# Patient Record
Sex: Female | Born: 1955 | Race: White | Hispanic: No | Marital: Married | Smoking: Former smoker
Health system: Southern US, Community
[De-identification: ages and names within clinical notes are randomized; demographics above are authoritative.]

## PROBLEM LIST (undated history)

## (undated) DIAGNOSIS — M1990 Unspecified osteoarthritis, unspecified site: Secondary | ICD-10-CM

## (undated) DIAGNOSIS — F419 Anxiety disorder, unspecified: Secondary | ICD-10-CM

## (undated) DIAGNOSIS — K219 Gastro-esophageal reflux disease without esophagitis: Secondary | ICD-10-CM

## (undated) HISTORY — DX: Unspecified osteoarthritis, unspecified site: M19.90

## (undated) HISTORY — PX: VAGINAL HYSTERECTOMY: SUR661

## (undated) HISTORY — DX: Gastro-esophageal reflux disease without esophagitis: K21.9

## (undated) HISTORY — PX: OTHER SURGICAL HISTORY: SHX169

## (undated) HISTORY — DX: Anxiety disorder, unspecified: F41.9

---

## 1999-08-18 ENCOUNTER — Other Ambulatory Visit: Admission: RE | Admit: 1999-08-18 | Discharge: 1999-08-18 | Payer: Self-pay | Admitting: *Deleted

## 2000-06-02 ENCOUNTER — Ambulatory Visit (HOSPITAL_COMMUNITY): Admission: RE | Admit: 2000-06-02 | Discharge: 2000-06-02 | Payer: Self-pay | Admitting: Family Medicine

## 2000-06-02 ENCOUNTER — Encounter: Payer: Self-pay | Admitting: Family Medicine

## 2000-06-02 ENCOUNTER — Encounter (INDEPENDENT_AMBULATORY_CARE_PROVIDER_SITE_OTHER): Payer: Self-pay | Admitting: *Deleted

## 2002-07-10 ENCOUNTER — Other Ambulatory Visit: Admission: RE | Admit: 2002-07-10 | Discharge: 2002-07-10 | Payer: Self-pay | Admitting: Obstetrics and Gynecology

## 2003-07-27 ENCOUNTER — Other Ambulatory Visit: Admission: RE | Admit: 2003-07-27 | Discharge: 2003-07-27 | Payer: Self-pay | Admitting: Obstetrics and Gynecology

## 2004-07-28 ENCOUNTER — Other Ambulatory Visit: Admission: RE | Admit: 2004-07-28 | Discharge: 2004-07-28 | Payer: Self-pay | Admitting: Obstetrics and Gynecology

## 2006-06-15 ENCOUNTER — Encounter: Admission: RE | Admit: 2006-06-15 | Discharge: 2006-06-15 | Payer: Self-pay

## 2006-08-26 ENCOUNTER — Encounter: Payer: Self-pay | Admitting: Oral and Maxillofacial Surgery

## 2006-09-09 ENCOUNTER — Encounter: Payer: Self-pay | Admitting: Oral and Maxillofacial Surgery

## 2008-11-05 ENCOUNTER — Ambulatory Visit: Payer: Self-pay | Admitting: Internal Medicine

## 2008-11-19 ENCOUNTER — Ambulatory Visit: Payer: Self-pay | Admitting: Internal Medicine

## 2009-04-29 ENCOUNTER — Encounter: Admission: RE | Admit: 2009-04-29 | Discharge: 2009-04-29 | Payer: Self-pay | Admitting: Obstetrics and Gynecology

## 2010-03-14 ENCOUNTER — Encounter: Admission: RE | Admit: 2010-03-14 | Discharge: 2010-03-14 | Payer: Self-pay | Admitting: Family Medicine

## 2010-06-10 ENCOUNTER — Encounter: Payer: Self-pay | Admitting: Internal Medicine

## 2010-06-11 ENCOUNTER — Encounter (INDEPENDENT_AMBULATORY_CARE_PROVIDER_SITE_OTHER): Payer: Self-pay | Admitting: *Deleted

## 2010-06-12 ENCOUNTER — Other Ambulatory Visit: Payer: Self-pay | Admitting: Otolaryngology

## 2010-06-12 DIAGNOSIS — K219 Gastro-esophageal reflux disease without esophagitis: Secondary | ICD-10-CM

## 2010-06-18 NOTE — Letter (Signed)
Summary: New Patient letter  [facility]  [address] [place], [place] [address]   Phone: [phone]  Fax: [phone]       06/11/2010 MRN: [id number]  Lauren Byrd [address] [place], [place]  [address]  Dear Ms. Lauren Byrd,  Welcome to the Gastroenterology Division at [facility].    You are scheduled to see Dr.  Marina Goodell on 07-17-10 at 1:45P.M. on the 3rd floor at [facility], [address]  We ask that you try to arrive at our office 15 minutes prior to your appointment time to allow for check-in.  We would like you to complete the enclosed self-administered evaluation form prior to your visit and bring it with you on the day of your appointment.  We will review it with you.  Also, please bring a complete list of all your medications or, if you prefer, bring the medication bottles and we will list them.  Please bring your insurance card so that we may make a copy of it.  If your insurance requires a referral to see a specialist, please bring your referral form from your primary care physician.  Co-payments are due at the time of your visit and may be paid by cash, check or credit card.     Your office visit will consist of a consult with your physician (includes a physical exam), any laboratory testing he/she may order, scheduling of any necessary diagnostic testing (e.g. x-ray, ultrasound, CT-scan), and scheduling of a procedure (e.g. Endoscopy, Colonoscopy) if required.  Please allow enough time on your schedule to allow for any/all of these possibilities.    If you cannot keep your appointment, please call [phone] to cancel or reschedule prior to your appointment date.  This allows [place] the opportunity to schedule an appointment for another patient in need of care.  If you do not cancel or reschedule by 5 p.m. the business day prior to your appointment date, you will be charged a $50.00 late cancellation/no-show fee.    Thank you for choosing  [facility] for your medical needs.  We appreciate the opportunity to care for you.  Please visit [place] at our website  to learn more about our practice.                     Sincerely,                                                             The Gastroenterology Division

## 2010-06-20 ENCOUNTER — Other Ambulatory Visit: Payer: Self-pay

## 2010-06-26 NOTE — Letter (Signed)
Summary: [facility]  [facility]   Imported By: Sherian Rein 06/18/2010 11:46:23  _____________________________________________________________________  External Attachment:    Type:   Image     Comment:   External Document

## 2010-07-17 ENCOUNTER — Encounter: Payer: Self-pay | Admitting: Internal Medicine

## 2010-07-17 ENCOUNTER — Ambulatory Visit (INDEPENDENT_AMBULATORY_CARE_PROVIDER_SITE_OTHER): Payer: 59 | Admitting: Internal Medicine

## 2010-07-17 DIAGNOSIS — K219 Gastro-esophageal reflux disease without esophagitis: Secondary | ICD-10-CM | POA: Insufficient documentation

## 2010-07-17 DIAGNOSIS — K5730 Diverticulosis of large intestine without perforation or abscess without bleeding: Secondary | ICD-10-CM | POA: Insufficient documentation

## 2010-07-22 NOTE — Letter (Signed)
Summary: EGD Instructions  [facility]  [address] [place], [place] [address]   Phone: [phone]  Fax: [phone]       Lauren Byrd    1955-08-24    MRN: [id number]       Procedure Day /Date:WEDNESDAY   08/13/10     Arrival Time: 1:30 PM     Procedure Time:2:30 PM     Location of Procedure:                    _X_ [facility] (4th Floor)   PREPARATION FOR ENDOSCOPY   On WEDNESDAY, 08/13/10 THE DAY OF THE PROCEDURE:  1.   No solid foods, milk or milk products are allowed after midnight the night before your procedure.  2.   Do not drink anything colored red or purple.  Avoid juices with pulp.  No orange juice.  3.  You may drink clear liquids until 12:30 PM, which is 2 hours before your procedure.                                                                                                CLEAR LIQUIDS INCLUDE: Water Jello Ice Popsicles Tea (sugar ok, no milk/cream) Powdered fruit flavored drinks Coffee (sugar ok, no milk/cream) Gatorade Juice: apple, white grape, white cranberry  Lemonade Clear bullion, consomm, broth Carbonated beverages (any kind) Strained chicken noodle soup Hard Candy   MEDICATION INSTRUCTIONS  Unless otherwise instructed, you should take regular prescription medications with a small sip of water as early as possible the morning of your procedure.            OTHER INSTRUCTIONS  You will need a responsible adult at least 54 years of age to accompany you and drive you home.   This person must remain in the waiting room during your procedure.  Wear loose fitting clothing that is easily removed.  Leave jewelry and other valuables at home.  However, you may wish to bring a book to read or an iPod/MP3 player to listen to music as you wait for your procedure to start.  Remove all body piercing jewelry and leave at home.  Total time from sign-in until discharge is approximately 2-3 hours.  You should go  home directly after your procedure and rest.  You can resume normal activities the day after your procedure.  The day of your procedure you should not:   Drive   Make legal decisions   Operate machinery   Drink alcohol   Return to work  You will receive specific instructions about eating, activities and medications before you leave.    The above instructions have been reviewed and explained to me by   _______________________    I fully understand and can verbalize these instructions _____________________________ Date _________

## 2010-07-22 NOTE — Assessment & Plan Note (Signed)
Summary:   "Throat burning "  not responding to GERD treatment   History of Present Illness Visit Type: Initial Consult Primary GI MD: Yancey Flemings MD Primary Provider: Lupita Raider, MD Requesting Provider: Algis Downs Chief Complaint: pt. c/o GERD which is not getting better.  Seems to be worse at night. History of Present Illness:    55 year old female with a history of osteoarthritis and anxiety disorder. She is sent today regarding complaints of pharyngeal burning and possible GERD. The patient was seen in this office on one occasion for direct screening colonoscopy November 19, 2008. This revealed moderate sigmoid diverticulosis but was otherwise normal. Routine followup in 10 years recommended. Her current history is that of abrupt onset throat and roof of the mouth burning that began in October 2011. Associated with this she has noticed numbness of her lips and a metallic taste in her mouth. No problems with heartburn , indigestion , regurgitation , odynophagia , or dysphagia. She was treated empirically with H2 receptor antagonist and Magic mouthwash. No improvement. Seen in followup shortly thereafter (reviewed). Laboratories unrevealing. CT scan for abdominal discomfort negative. Subsequently seen by ENT. Laryngoscopy with nonspecific post cricoid erythema. Change to Dexilant and ranitidine. Has been taking daily. Despite this, no change in symptoms. No active problems with abdominal discomfort. No alarm features such as weight loss. She cannot identify any factors that seem to improve the problem. However, does seem worse at night. No effect on eating.   GI Review of Systems    Reports acid reflux and  belching.      Denies abdominal pain, bloating, chest pain, dysphagia with liquids, dysphagia with solids, heartburn, loss of appetite, nausea, vomiting, vomiting blood, weight loss, and  weight gain.      Reports diarrhea.     Denies anal fissure, black tarry stools, change in bowel  habit, constipation, diverticulosis, fecal incontinence, heme positive stool, hemorrhoids, irritable bowel syndrome, jaundice, light color stool, liver problems, rectal bleeding, and  rectal pain. Preventive Screening-Counseling & Management      Drug Use:  no.      Current Medications (verified): 1)  Dexilant 60 Mg Cpdr (Dexlansoprazole) .Marland Kitchen.. 1 By Mouth Once Daily in The Evening 2)  Ranitidine Hcl 75 Mg Tabs (Ranitidine Hcl) .Marland Kitchen.. 1 By Mouth Mid Morning  Allergies (verified): 1)  ! Sulfa  Past History:  Past Medical History: GERD Anxiety Disorder Arthritis  Past Surgical History: Hysterectomy ACL Replacement  Jaw Joint Replacement  Family History: Reviewed history and no changes required. heart disease No FH of Colon Cancer:  Social History: Reviewed history and no changes required. 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  The patient denies back pain, blood in urine, change in vision, confusion, cough, coughing up blood, depression-new, fainting, fatigue, fever, headaches-new, hearing problems, heart murmur, heart rhythm changes, itching, menstrual pain, muscle pains/cramps, nosebleeds, pregnancy symptoms, shortness of breath, skin rash, sleeping problems, sore throat, swelling of feet/legs, swollen lymph glands, thirst - excessive , urination - excessive , urination changes/pain, urine leakage, vision changes, and voice change.    Vital Signs:  Patient profile:   55 year old female Height:      64 inches Weight:      168.2 pounds BMI:  28.98 Pulse rate:   88 / minute Pulse rhythm:   regular BP sitting:   140 / 88  (left arm)  Vitals Entered By: Milford Cage NCMA (July 17, 2010 1:40 PM)  Physical Exam  General:  Well developed,  well nourished, no acute distress. Head:  Normocephalic and atraumatic. Eyes:  PERRLA, no icterus. Ears:  Normal auditory acuity. Nose:  No deformity, discharge,  or lesions. Mouth:  No deformity or lesions, dentition normal. Neck:  Supple; no masses or thyromegaly. Lungs:  Clear throughout to auscultation. Heart:  Regular rate and rhythm; no murmurs, rubs,  or bruits. Abdomen:  Soft, nontender and nondistended. No masses, hepatosplenomegaly or hernias noted. Normal bowel sounds. Msk:  Symmetrical with no gross deformities. Normal posture. Pulses:  Normal pulses noted. Extremities:  No clubbing, cyanosis, edema or deformities noted. Neurologic:  Alert and  oriented x4;  grossly normal neurologically. Skin:  Intact without significant lesions or rashes. Psych:  Alert and cooperative. Normal mood and affect.   Impression & Recommendations:  Problem # 1:  GERD (ICD-530.81) the patient presents with complaints of pharyngeal and mouth burning as well as metallic taste as described. it is not at all clear to me that this represents  GERD. Symptoms would be quite atypical. Furthermore, absolutely no response to it should be adequate acid suppressive therapy. Prior ENT evaluation as described. The patient does have some anxiety regarding possible occult malignancy..   Plan  #1. Plan upper endoscopy to assess for any objective evidence of GERD as well as to rule out structural abnormalities that may provide the patient with reassurance. I told her that the yield of this examination is likely quite low. However, she is interested in proceeding. I discussed with her the nature of the procedure as well as the risks, benefits, and alternatives. She understood and wished to proceed. If the examination is negative, then she will return to Dr. Clelia Croft to consider further evaluation or empiric therapies. May be related to anxiety and some form, though not certain. I will leave this up to Dr. Clelia Croft  Problem # 2:   SCREENING COLORECTAL-CANCER (ICD-V76.51)  negative index exam July 2010.  Problem # 3:  DIVERTICULOSIS-COLON (ICD-562.10) Assessment: Comment Only  Other Orders: EGD (EGD)  Patient Instructions: 1)  EGD [facility] 08/13/10  2:30 PM arrive at 1:30 pm on 4th floor. 2)  EGD instructions given. 3)  Upper Endoscopy brochure given.  4)  Copy sent to Lupita Raider, MD 5)  The medication list was reviewed and reconciled.  All changed / newly prescribed medications were explained.  A complete medication list was provided to the patient / caregiver.

## 2010-08-12 ENCOUNTER — Encounter: Payer: Self-pay | Admitting: Internal Medicine

## 2010-08-13 ENCOUNTER — Ambulatory Visit (AMBULATORY_SURGERY_CENTER): Payer: 59 | Admitting: Internal Medicine

## 2010-08-13 ENCOUNTER — Encounter: Payer: Self-pay | Admitting: Internal Medicine

## 2010-08-13 VITALS — BP 162/91 | HR 80 | Temp 97.7°F | Resp 23 | Ht 64.0 in | Wt 164.0 lb

## 2010-08-13 DIAGNOSIS — R1013 Epigastric pain: Secondary | ICD-10-CM

## 2010-08-13 DIAGNOSIS — R198 Other specified symptoms and signs involving the digestive system and abdomen: Secondary | ICD-10-CM

## 2010-08-13 DIAGNOSIS — K219 Gastro-esophageal reflux disease without esophagitis: Secondary | ICD-10-CM

## 2010-08-13 DIAGNOSIS — K3189 Other diseases of stomach and duodenum: Secondary | ICD-10-CM

## 2010-08-13 MED ORDER — SODIUM CHLORIDE 0.9 % IV SOLN: 500.0000 mL | INTRAVENOUS | Status: DC

## 2010-08-13 NOTE — Patient Instructions (Signed)
Please follow discharge instructions. Stop acid suppressive medication Please return to care of Dr. Clelia Croft

## 2010-08-14 ENCOUNTER — Telehealth: Payer: Self-pay | Admitting: *Deleted

## 2010-08-14 NOTE — Telephone Encounter (Signed)

## 2012-08-18 ENCOUNTER — Ambulatory Visit (INDEPENDENT_AMBULATORY_CARE_PROVIDER_SITE_OTHER): Payer: 59 | Admitting: Family Medicine

## 2012-08-18 ENCOUNTER — Encounter: Payer: Self-pay | Admitting: Family Medicine

## 2012-08-18 VITALS — BP 110/72 | HR 80 | Temp 98.5°F | Resp 18 | Wt 162.0 lb

## 2012-08-18 DIAGNOSIS — K219 Gastro-esophageal reflux disease without esophagitis: Secondary | ICD-10-CM | POA: Insufficient documentation

## 2012-08-18 DIAGNOSIS — F419 Anxiety disorder, unspecified: Secondary | ICD-10-CM | POA: Insufficient documentation

## 2012-08-18 DIAGNOSIS — M1990 Unspecified osteoarthritis, unspecified site: Secondary | ICD-10-CM | POA: Insufficient documentation

## 2012-08-18 DIAGNOSIS — J019 Acute sinusitis, unspecified: Secondary | ICD-10-CM

## 2012-08-18 MED ORDER — HYDROCODONE-HOMATROPINE 5-1.5 MG/5ML PO SYRP: 5.0000 mL | ORAL_SOLUTION | ORAL | Status: DC | PRN

## 2012-08-18 MED ORDER — AMOXICILLIN-POT CLAVULANATE 875-125 MG PO TABS: 1.0000 | ORAL_TABLET | Freq: Two times a day (BID) | ORAL | Status: DC

## 2012-08-18 NOTE — Progress Notes (Signed)
  Subjective:    Patient ID: Lauren Byrd, female    DOB: April 24, 1956, 57 y.o.   MRN: [id number]  HPI  Patient reports one week of pain and pressure and both frontal sinuses. She's having lots of congestion, rhinorrhea, postnasal drip, and a persistent hacking nonproductive cough.  She reports subjective fevers, fever malaise, and myalgias.  She denies sore throat nausea vomiting or diarrhea.  Both ears feel stopped up. Past Medical History  Diagnosis Date  . Anxiety   . Arthritis   . GERD (gastroesophageal reflux disease)    Current Outpatient Prescriptions on File Prior to Visit  Medication Sig Dispense Refill  . ranitidine (ZANTAC) 75 MG tablet Take 75 mg by mouth daily.         Current Facility-Administered Medications on File Prior to Visit  Medication Dose Route Frequency Provider Last Rate Last Dose  . 0.9 %  sodium chloride infusion  500 mL Intravenous Continuous Hilarie Fredrickson, MD         Review of Systems    remainder review of systems is negative Objective:   Physical Exam  Constitutional: She appears well-developed and well-nourished.  HENT:  Head: Normocephalic.  Right Ear: External ear normal.  Left Ear: External ear normal.  Nose: Mucosal edema and rhinorrhea present. Right sinus exhibits frontal sinus tenderness. Left sinus exhibits frontal sinus tenderness.  Mouth/Throat: Oropharynx is clear and moist. No oropharyngeal exudate.  Eyes: Conjunctivae are normal. Pupils are equal, round, and reactive to light.  Neck: Normal range of motion. Neck supple. No JVD present.  Cardiovascular: Normal rate, regular rhythm, normal heart sounds and intact distal pulses.   Pulmonary/Chest: Effort normal and breath sounds normal. No respiratory distress. She has no wheezes. She has no rales.  Lymphadenopathy:    She has no cervical adenopathy.          Assessment & Plan:  Sinusitis, cough due to postnasal drip  Augmentin 875 mg by mouth twice a day for 10 days Hycodan 5  ML's by mouth every 4 hours when necessary cough

## 2012-11-02 ENCOUNTER — Ambulatory Visit (INDEPENDENT_AMBULATORY_CARE_PROVIDER_SITE_OTHER): Payer: 59 | Admitting: Physician Assistant

## 2012-11-02 ENCOUNTER — Encounter: Payer: Self-pay | Admitting: Physician Assistant

## 2012-11-02 VITALS — BP 110/76 | HR 60 | Temp 98.7°F | Resp 18 | Wt 163.0 lb

## 2012-11-02 DIAGNOSIS — J029 Acute pharyngitis, unspecified: Secondary | ICD-10-CM

## 2012-11-02 LAB — RAPID STREP SCREEN (MED CTR MEBANE ONLY)

## 2012-11-02 MED ORDER — AMOXICILLIN 500 MG PO CAPS: 500.0000 mg | ORAL_CAPSULE | Freq: Three times a day (TID) | ORAL | Status: DC

## 2012-11-02 NOTE — Progress Notes (Signed)
   Patient ID: Lauren Byrd MRN: [id number], DOB: [birth year], 56 y.o. Date of Encounter: 11/02/2012, 3:59 PM    Chief Complaint:  Chief Complaint  Patient presents with  . c/o sore throat with radiating pain up thru jaw and rt ear    exposed to strep     HPI: 57 y.o. year old female presents for eval of sore throat. Was with 2 people this weekend who were on antibiotics b/c of positive strep tests.  Pt developed sore throat yesterday. Today with sore throat, radiating to right ear.  Has had no nasal congestion/rhinorrhea, no cough, no fever/chills.   Home Meds: See attached medication section for any medications that were entered at today's visit. The computer does not put those onto this list.The following list is a list of meds entered prior to today's visit.   No current outpatient prescriptions on file prior to visit.   Current Facility-Administered Medications on File Prior to Visit  Medication Dose Route Frequency Provider Last Rate Last Dose  . 0.9 %  sodium chloride infusion  500 mL Intravenous Continuous Hilarie Fredrickson, MD        Allergies:  Allergies  Allergen Reactions  . Sulfonamide Derivatives     REACTION: Hives      Review of Systems: See HPI for pertinent ROS. All other ROS negative.    Physical Exam: Blood pressure 110/76, pulse 60, temperature 98.7 F (37.1 C), temperature source Oral, resp. rate 18, weight 163 lb (73.936 kg)., Body mass index is 27.97 kg/(m^2). General: WNWD WF. Appears in no acute distress. HEENT: Normocephalic, atraumatic, eyes without discharge, sclera non-icteric, nares are without discharge. Bilateral auditory canals clear, TM's are without perforation, pearly grey and translucent with reflective cone of light bilaterally. Oral cavity mois. Very minimal erythema. No exudate. No peritonsillar abscess, or post nasal drip.  Neck: Supple. No thyromegaly. No lymphadenopathy.Right tonsillar node is mildly tender with palpation but not  enlarged. No tenderness or enlargement of cervical nodes. Lungs: Clear bilaterally to auscultation without wheezes, rales, or rhonchi. Breathing is unlabored. Heart: Regular rhythm. No murmurs, rubs, or gallops. Msk:  Strength and tone normal for age. Neuro: Alert and oriented X 3. Moves all extremities spontaneously. Gait is normal. CNII-XII grossly in tact. Psych:  Responds to questions appropriately with a normal affect.   Results for orders placed in visit on 11/02/12  RAPID STREP SCREEN      Result Value Range   Source THROAT     Streptococcus, Group A Screen (Direct) NEG  NEGATIVE     ASSESSMENT AND PLAN:  57 y.o. year old female with  1. Sore throat - Rapid Strep Screen  2. Acute pharyngitis Discussed viral vs bacterial infections. If viral, will improve, resolve without treatment. If bacterial, will need abx.  Currently exam and RST not c/w step but may be b/c early in coarse. She was exposed to people with positive strep tests. She will monitor for 1-2 days. If symptoms do not improve, then she will start abx and will complete ALL of abx.  Lozenge, spray., Tylenol, Ibuprofen prn pain. - amoxicillin (AMOXIL) 500 MG capsule; Take 1 capsule (500 mg total) by mouth 3 (three) times daily.  Dispense: 30 capsule; Refill: 0   Signed, [address] [place], [place], [facility] 11/02/2012 3:59 PM

## 2013-04-03 ENCOUNTER — Encounter: Payer: Self-pay | Admitting: Physician Assistant

## 2013-04-03 ENCOUNTER — Ambulatory Visit (INDEPENDENT_AMBULATORY_CARE_PROVIDER_SITE_OTHER): Payer: 59 | Admitting: Physician Assistant

## 2013-04-03 VITALS — BP 114/76 | HR 76 | Temp 98.8°F | Resp 18 | Ht 64.0 in | Wt 166.0 lb

## 2013-04-03 DIAGNOSIS — A499 Bacterial infection, unspecified: Secondary | ICD-10-CM

## 2013-04-03 DIAGNOSIS — J988 Other specified respiratory disorders: Secondary | ICD-10-CM

## 2013-04-03 MED ORDER — BENZONATATE 200 MG PO CAPS: 200.0000 mg | ORAL_CAPSULE | Freq: Two times a day (BID) | ORAL | Status: DC | PRN

## 2013-04-03 MED ORDER — AZITHROMYCIN 250 MG PO TABS: ORAL_TABLET | ORAL | Status: DC

## 2013-04-03 NOTE — Progress Notes (Signed)
Patient ID: Lauren Byrd MRN: [id number], DOB: 06-09-1955, 57 y.o. Date of Encounter: 04/03/2013, 11:50 AM    Chief Complaint:  Chief Complaint  Patient presents with  . cough, headache, lost voice    x 5 days     HPI: 57 y.o. year old white female reports that she started feeling sick 5 days ago. Is getting much worse. In the beginning she just felt stuffy and congested. Then she lost her voice for a day but that has come back. The last several nights she has got no sleep secondary to congestion and cough. Says that now she is " feeling miserable". Says that with her nose as she was getting out some mucus but now it is pretty much dry but still feels pressure and congestion. She's having a cough and can feel loose phlegm but is unable to get it out. No definite fevers or chills. She has used Mucinex with no relief.     Home Meds: See attached medication section for any medications that were entered at today's visit. The computer does not put those onto this list.The following list is a list of meds entered prior to today's visit.   Current Outpatient Prescriptions on File Prior to Visit  Medication Sig Dispense Refill  . ALPRAZolam (XANAX) 0.25 MG tablet Take 0.25 mg by mouth as needed for sleep. Very rarely uses       Current Facility-Administered Medications on File Prior to Visit  Medication Dose Route Frequency Provider Last Rate Last Dose  . 0.9 %  sodium chloride infusion  500 mL Intravenous Continuous Hilarie Fredrickson, MD        Allergies:  Allergies  Allergen Reactions  . Sulfonamide Derivatives     REACTION: Hives      Review of Systems: See HPI for pertinent ROS. All other ROS negative.    Physical Exam: Blood pressure 114/76, pulse 76, temperature 98.8 F (37.1 C), temperature source Oral, resp. rate 18, height 5\' 4"  (1.626 m), weight 166 lb (75.297 kg)., Body mass index is 28.48 kg/(m^2). General: WNWD WF. Does appear ill but nontoxic. Appears in no acute  distress. HEENT: Normocephalic, atraumatic, eyes without discharge, sclera non-icteric, nares are without discharge. Bilateral auditory canals clear, TM's are without perforation, pearly grey and translucent with reflective cone of light bilaterally. Oral cavity moist, posterior pharynx without exudate, erythema, peritonsillar abscess, or post nasal drip. She points to her frontal sinuses is an area of pressure and congestion but there isn't much tenderness with percussion on exam. No tenderness with percussion of maxillary sinuses bilaterally  Neck: Supple. No thyromegaly. No lymphadenopathy. Lungs: Clear bilaterally to auscultation without wheezes, rales, or rhonchi. Breathing is unlabored. Heart: Regular rhythm. No murmurs, rubs, or gallops. Msk:  Strength and tone normal for age. Extremities/Skin: Warm and dry. No clubbing or cyanosis. No edema. No rashes or suspicious lesions. Neuro: Alert and oriented X 3. Moves all extremities spontaneously. Gait is normal. CNII-XII grossly in tact. Psych:  Responds to questions appropriately with a normal affect.     ASSESSMENT AND PLAN:  57 y.o. year old female with  1. Bacterial respiratory infection - azithromycin (ZITHROMAX) 250 MG tablet; Day 1: Take 2 daily.  Days 2-5: Take 1 daily.  Dispense: 6 tablet; Refill: 0 - benzonatate (TESSALON) 200 MG capsule; Take 1 capsule (200 mg total) by mouth 2 (two) times daily as needed for cough.  Dispense: 20 capsule; Refill: 0 If symptoms do not resolve with completion of  the above, then followup.  Signed, [address] [place], [place], [facility] 04/03/2013 11:50 AM

## 2013-04-10 ENCOUNTER — Ambulatory Visit: Payer: 59 | Admitting: Physician Assistant

## 2014-06-27 ENCOUNTER — Ambulatory Visit: Payer: Self-pay | Admitting: Physician Assistant

## 2014-06-27 ENCOUNTER — Ambulatory Visit (INDEPENDENT_AMBULATORY_CARE_PROVIDER_SITE_OTHER): Payer: 59 | Admitting: Physician Assistant

## 2014-06-27 ENCOUNTER — Encounter: Payer: Self-pay | Admitting: Physician Assistant

## 2014-06-27 VITALS — BP 130/88 | HR 76 | Temp 97.9°F | Resp 18 | Wt 172.0 lb

## 2014-06-27 DIAGNOSIS — B9689 Other specified bacterial agents as the cause of diseases classified elsewhere: Secondary | ICD-10-CM

## 2014-06-27 DIAGNOSIS — J988 Other specified respiratory disorders: Secondary | ICD-10-CM

## 2014-06-27 DIAGNOSIS — F419 Anxiety disorder, unspecified: Secondary | ICD-10-CM

## 2014-06-27 MED ORDER — AZITHROMYCIN 250 MG PO TABS: ORAL_TABLET | ORAL | Status: DC

## 2014-06-27 MED ORDER — ALPRAZOLAM 0.25 MG PO TABS: 0.2500 mg | ORAL_TABLET | ORAL | Status: DC | PRN

## 2014-06-27 NOTE — Progress Notes (Signed)
    Patient ID: Lauren Byrd MRN: [id number], DOB: 03/27/1956, 59 y.o. Date of Encounter: 06/27/2014, 3:57 PM    Chief Complaint:  Chief Complaint  Patient presents with  . sick x 1 week    ear ache rt, head congestion, cough  . Medication Refill    alprazolam  uses sparingly  bottle she has expired in 2014     HPI: 59 y.o. year old white female says that she has been quite sick and congested for about 8 days now. Has a lot of head and nasal congestion. Also a lot of chest congestion and cough. Has had no sore throat. Starting to feel some pressure in the right ear. No known fevers or chills.  She brings in an old bottle of alprazolam which still has some pills remaining in it and the bottle expired 2014!! However says that she does like to have some on hand in case she does feel panic/anxiety.     Home Meds:   Outpatient Prescriptions Prior to Visit  Medication Sig Dispense Refill  . ALPRAZolam (XANAX) 0.25 MG tablet Take 0.25 mg by mouth as needed for sleep. Very rarely uses    . azithromycin (ZITHROMAX) 250 MG tablet Day 1: Take 2 daily.  Days 2-5: Take 1 daily. 6 tablet 0  . benzonatate (TESSALON) 200 MG capsule Take 1 capsule (200 mg total) by mouth 2 (two) times daily as needed for cough. 20 capsule 0   Facility-Administered Medications Prior to Visit  Medication Dose Route Frequency Provider Last Rate Last Dose  . 0.9 %  sodium chloride infusion  500 mL Intravenous Continuous Hilarie FredricksonJohn N Perry, MD        Allergies:  Allergies  Allergen Reactions  . Sulfonamide Derivatives     REACTION: Hives      Review of Systems: See HPI for pertinent ROS. All other ROS negative.    Physical Exam: Blood pressure 130/88, pulse 76, temperature 97.9 F (36.6 C), temperature source Oral, resp. rate 18, weight 172 lb (78.019 kg)., Body mass index is 29.51 kg/(m^2). General: WNWD WF.  Appears in no acute distress. HEENT: Normocephalic, atraumatic, eyes without discharge, sclera  non-icteric, nares are without discharge. Bilateral auditory canals clear, TM's are without perforation, pearly grey and translucent with reflective cone of light bilaterally. Oral cavity moist, posterior pharynx without exudate, erythema, peritonsillar abscess. No tenderness with percussion of frontal and maxillary sinuses bilaterally.  Neck: Supple. No thyromegaly. No lymphadenopathy. Lungs: Clear bilaterally to auscultation without wheezes, rales, or rhonchi. Breathing is unlabored. Heart: Regular rhythm. No murmurs, rubs, or gallops. Msk:  Strength and tone normal for age. Extremities/Skin: Warm and dry.  Neuro: Alert and oriented X 3. Moves all extremities spontaneously. Gait is normal. CNII-XII grossly in tact. Psych:  Responds to questions appropriately with a normal affect.     ASSESSMENT AND PLAN:  59 y.o. year old female with  1. Bacterial respiratory infection - azithromycin (ZITHROMAX) 250 MG tablet; Day 1: Take 2 daily.  Days 2-5: Take 1 daily.  Dispense: 6 tablet; Refill: 0 Take antibiotic as directed and complete all of it. Also recommend Mucinex DM as expectorant. Follow-up if symptoms do not resolve within 1 week of completion of antibiotic.  2. Anxiety - ALPRAZolam (XANAX) 0.25 MG tablet; Take 1 tablet (0.25 mg total) by mouth as needed for sleep. Very rarely uses  Dispense: 30 tablet; Refill: 0   Signed, [address], [place], [facility] 06/27/2014 3:57 PM

## 2014-11-06 ENCOUNTER — Encounter: Payer: Self-pay | Admitting: Internal Medicine

## 2015-09-02 ENCOUNTER — Ambulatory Visit (INDEPENDENT_AMBULATORY_CARE_PROVIDER_SITE_OTHER): Payer: 59 | Admitting: Physician Assistant

## 2015-09-02 ENCOUNTER — Encounter: Payer: Self-pay | Admitting: Physician Assistant

## 2015-09-02 VITALS — BP 132/86 | HR 68 | Temp 98.3°F | Resp 18 | Wt 163.0 lb

## 2015-09-02 DIAGNOSIS — B9689 Other specified bacterial agents as the cause of diseases classified elsewhere: Secondary | ICD-10-CM

## 2015-09-02 DIAGNOSIS — J988 Other specified respiratory disorders: Secondary | ICD-10-CM | POA: Diagnosis not present

## 2015-09-02 DIAGNOSIS — F419 Anxiety disorder, unspecified: Secondary | ICD-10-CM | POA: Diagnosis not present

## 2015-09-02 DIAGNOSIS — B9789 Other viral agents as the cause of diseases classified elsewhere: Secondary | ICD-10-CM

## 2015-09-02 DIAGNOSIS — B349 Viral infection, unspecified: Secondary | ICD-10-CM

## 2015-09-02 MED ORDER — HYDROCODONE-HOMATROPINE 5-1.5 MG/5ML PO SYRP: 5.0000 mL | ORAL_SOLUTION | Freq: Three times a day (TID) | ORAL | Status: DC | PRN

## 2015-09-02 MED ORDER — ALPRAZOLAM 0.25 MG PO TABS: 0.2500 mg | ORAL_TABLET | ORAL | Status: DC | PRN

## 2015-09-02 NOTE — Progress Notes (Signed)
Patient ID: Lauren Byrd MRN: [id number], DOB: 1956/04/29, 60 y.o. Date of Encounter: 09/02/2015, 10:40 AM    Chief Complaint:  Chief Complaint  Patient presents with  . c/o sinus infection/ear ache    wants to discuss panic attacks/med refills     HPI: 60 y.o. year old white female presents with above.  Reviewed that last prescription for Xanax was 06/27/14 for #30+0. She has had no refill since then. She says that she actually still has 6 or 7 pills remaining in that bottle but she wanted to go ahead and get more while she is here. Also is afraid that she is going to need to take some coming up. Says that her boss just resigned. Says that her dad just had 2 strokes one week ago.  Says that she thinks that actually the fact that she is run down from the above issues is contributing to her current illness and her immune system being low. Says that symptoms started Saturday 08/31/15. At that time she just had a tickle in her throat. Later that day she just "felt bad in general ". Yesterday she did not leave the house. Developed congestion in her head and nose. Does not feel that she is having any chest congestion. 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 Has had no fevers or chills. No known sick contacts at work or home but has been visiting the hospital recently.    Home Meds:   Outpatient Prescriptions Prior to Visit  Medication Sig Dispense Refill  . ALPRAZolam (XANAX) 0.25 MG tablet Take 1 tablet (0.25 mg total) by mouth as needed for sleep. Very rarely uses 30 tablet 0  . azithromycin (ZITHROMAX) 250 MG tablet Day 1: Take 2 daily.  Days 2-5: Take 1 daily. 6 tablet 0   Facility-Administered Medications Prior to Visit  Medication Dose Route Frequency Provider Last Rate Last Dose  . 0.9 %  sodium chloride infusion  500 mL Intravenous Continuous Hilarie Fredrickson, MD        Allergies:  Allergies  Allergen Reactions  . Sulfonamide  Derivatives     REACTION: Hives      Review of Systems: See HPI for pertinent ROS. All other ROS negative.    Physical Exam: Blood pressure 132/86, pulse 68, temperature 98.3 F (36.8 C), temperature source Oral, resp. rate 18, weight 163 lb (73.936 kg)., Body mass index is 27.97 kg/(m^2). General: WNWD WF.  Appears in no acute distress. HEENT: Normocephalic, atraumatic, eyes without discharge, sclera non-icteric, nares are without discharge. Bilateral auditory canals clear, TM's are without perforation, pearly grey and translucent with reflective cone of light bilaterally. Oral cavity moist, posterior pharynx without exudate, erythema, peritonsillar abscess. No tenderness with percussion of frontal or maxillary sinuses bilaterally.  Neck: Supple. No thyromegaly. No lymphadenopathy. Lungs: Clear bilaterally to auscultation without wheezes, rales, or rhonchi. Breathing is unlabored. Heart: Regular rhythm. No murmurs, rubs, or gallops. Msk:  Strength and tone normal for age. Extremities/Skin: Warm and dry. Neuro: Alert and oriented X 3. Moves all extremities spontaneously. Gait is normal. CNII-XII grossly in tact. Psych:  Responds to questions appropriately with a normal affect.     ASSESSMENT AND PLAN:  60 y.o. year old female with  1. Viral respiratory infection Discussed with her that most likely this is a viral infection which means that it would run its course and resolve on its own. In the interim  she can use over-the-counter medications for symptom relief. She also asked what she continues as a cough suppressant especially for her to get some sleep. Will give Hycodan to use for this. Recommend that she stay home from work today and tomorrow and rest and sleep as much as possible. If she develops fever or symptoms worsen significantly or persist greater than 7 days then call and will consider adding antibiotic. - HYDROcodone-homatropine (HYCODAN) 5-1.5 MG/5ML syrup; Take 5 mLs by mouth  every 8 (eight) hours as needed for cough.  Dispense: 120 mL; Refill: 0  2. Anxiety She uses the Xanax very sparingly. We'll continue to use this as needed. - ALPRAZolam (XANAX) 0.25 MG tablet; Take 1 tablet (0.25 mg total) by mouth as needed for sleep. Very rarely uses  Dispense: 30 tablet; Refill: 0     Signed, [address] [place], [place], [facility] 09/02/2015 10:40 AM

## 2016-01-14 ENCOUNTER — Emergency Department (HOSPITAL_COMMUNITY): Payer: 59

## 2016-01-14 ENCOUNTER — Encounter (HOSPITAL_COMMUNITY): Payer: Self-pay | Admitting: Emergency Medicine

## 2016-01-14 ENCOUNTER — Emergency Department (HOSPITAL_COMMUNITY)
Admission: EM | Admit: 2016-01-14 | Discharge: 2016-01-14 | Disposition: A | Discharge status: Home / Self Care | Payer: 59 | Attending: Emergency Medicine | Admitting: Emergency Medicine

## 2016-01-14 DIAGNOSIS — R1011 Right upper quadrant pain: Secondary | ICD-10-CM | POA: Diagnosis not present

## 2016-01-14 DIAGNOSIS — R197 Diarrhea, unspecified: Secondary | ICD-10-CM | POA: Diagnosis not present

## 2016-01-14 DIAGNOSIS — R1010 Upper abdominal pain, unspecified: Secondary | ICD-10-CM | POA: Insufficient documentation

## 2016-01-14 DIAGNOSIS — Z87891 Personal history of nicotine dependence: Secondary | ICD-10-CM | POA: Diagnosis not present

## 2016-01-14 DIAGNOSIS — R112 Nausea with vomiting, unspecified: Secondary | ICD-10-CM

## 2016-01-14 LAB — CBC
HCT: 43.6 % (ref 36.0–46.0)
HEMOGLOBIN: 13.8 g/dL (ref 12.0–15.0)
MCH: 29.9 pg (ref 26.0–34.0)
MCHC: 31.7 g/dL (ref 30.0–36.0)
MCV: 94.6 fL (ref 78.0–100.0)
Platelets: 323 10*3/uL (ref 150–400)
RBC: 4.61 MIL/uL (ref 3.87–5.11)
RDW: 13.4 % (ref 11.5–15.5)
WBC: 18.1 10*3/uL — ABNORMAL HIGH (ref 4.0–10.5)

## 2016-01-14 LAB — URINALYSIS, ROUTINE W REFLEX MICROSCOPIC
Bilirubin Urine: NEGATIVE
GLUCOSE, UA: NEGATIVE mg/dL
HGB URINE DIPSTICK: NEGATIVE
Ketones, ur: NEGATIVE mg/dL
Leukocytes, UA: NEGATIVE
Nitrite: NEGATIVE
Protein, ur: NEGATIVE mg/dL
SPECIFIC GRAVITY, URINE: 1.025 (ref 1.005–1.030)
pH: 7.5 (ref 5.0–8.0)

## 2016-01-14 LAB — COMPREHENSIVE METABOLIC PANEL
ALBUMIN: 4.1 g/dL (ref 3.5–5.0)
ALT: 20 U/L (ref 14–54)
ANION GAP: 8 (ref 5–15)
AST: 21 U/L (ref 15–41)
Alkaline Phosphatase: 60 U/L (ref 38–126)
BUN: 23 mg/dL — ABNORMAL HIGH (ref 6–20)
CHLORIDE: 106 mmol/L (ref 101–111)
CO2: 25 mmol/L (ref 22–32)
CREATININE: 0.81 mg/dL (ref 0.44–1.00)
Calcium: 8.8 mg/dL — ABNORMAL LOW (ref 8.9–10.3)
GFR calc non Af Amer: >60 mL/min (ref >60)
GLUCOSE: 147 mg/dL — AB (ref 65–99)
Potassium: 3.9 mmol/L (ref 3.5–5.1)
SODIUM: 139 mmol/L (ref 135–145)
Total Bilirubin: 0.8 mg/dL (ref 0.3–1.2)
Total Protein: 7.1 g/dL (ref 6.5–8.1)

## 2016-01-14 LAB — LIPASE, BLOOD: LIPASE: 22 U/L (ref 11–51)

## 2016-01-14 MED ORDER — ONDANSETRON HCL 4 MG/2ML IJ SOLN
4.0000 mg | Freq: Once | INTRAMUSCULAR | Status: AC
  Administered 2016-01-14: 4 mg via INTRAVENOUS
  Filled 2016-01-14: qty 2

## 2016-01-14 MED ORDER — DICYCLOMINE HCL 20 MG PO TABS
20.0000 mg | ORAL_TABLET | Freq: Two times a day (BID) | ORAL | 0 refills | Status: DC
Start: 2016-01-14 — End: 2018-08-04

## 2016-01-14 MED ORDER — DICYCLOMINE HCL 10 MG PO CAPS
10.0000 mg | ORAL_CAPSULE | Freq: Once | ORAL | Status: AC
  Administered 2016-01-14: 10 mg via ORAL
  Filled 2016-01-14: qty 1

## 2016-01-14 MED ORDER — MORPHINE SULFATE (PF) 4 MG/ML IV SOLN
4.0000 mg | Freq: Once | INTRAVENOUS | Status: AC
  Administered 2016-01-14: 4 mg via INTRAVENOUS
  Filled 2016-01-14: qty 1

## 2016-01-14 MED ORDER — SODIUM CHLORIDE 0.9 % IV BOLUS (SEPSIS)
1000.0000 mL | Freq: Once | INTRAVENOUS | Status: AC
  Administered 2016-01-14: 1000 mL via INTRAVENOUS

## 2016-01-14 MED ORDER — SODIUM CHLORIDE 0.9 % IV BOLUS (SEPSIS): 1000.0000 mL | Freq: Once | INTRAVENOUS | Status: DC

## 2016-01-14 MED ORDER — ACIDOPHILUS PROBIOTIC 10 MG PO TABS: 10.0000 mg | ORAL_TABLET | Freq: Three times a day (TID) | ORAL | 0 refills | Status: DC

## 2016-01-14 MED ORDER — ONDANSETRON 4 MG PO TBDP: 4.0000 mg | ORAL_TABLET | Freq: Three times a day (TID) | ORAL | 0 refills | Status: DC | PRN

## 2016-01-14 MED ORDER — IOPAMIDOL (ISOVUE-300) INJECTION 61%
INTRAVENOUS | Status: AC
  Administered 2016-01-14: 100 mL
  Filled 2016-01-14: qty 100

## 2016-01-14 NOTE — ED Triage Notes (Signed)
N/v/d since diarrhea since  Last night abd paoin

## 2016-01-14 NOTE — ED Provider Notes (Signed)
[facility]-EMERGENCY DEPT Provider Note   CSN: [id number] Arrival date & time: 01/14/16  0815     History   Chief Complaint Chief Complaint  Patient presents with  . Emesis  . Abdominal Pain    HPI Lauren Byrd is a 60 y.o. female.  Lauren Byrd is a 60 y.o. Female who presents to the ED complaining of nausea, vomiting, diarrhea and abdominal pain since 12:30 this morning. Patient reports she woke up around 12:30 this morning with nausea and vomiting. She reports this then progressed to diarrhea as well as upper abdominal pain. She reports pain across her upper abdomen. Multiple episodes of diarrhea and vomiting today. No treatments prior to arrival. She's had a previous hysterectomy. No other abdominal surgeries. No sick contacts. She denies fevers, chest pain, shortness of breath, coughing, urinary symptoms, or rashes.   The history is provided by the patient. No language interpreter was used.  Emesis   Associated symptoms include abdominal pain and diarrhea. Pertinent negatives include no chills, no cough, no fever and no headaches.  Abdominal Pain   Associated symptoms include diarrhea, nausea and vomiting. Pertinent negatives include fever, dysuria, frequency and headaches.    Past Medical History:  Diagnosis Date  . Anxiety   . Arthritis   . GERD (gastroesophageal reflux disease)     Patient Active Problem List   Diagnosis Date Noted  . Anxiety   . Arthritis   . GERD (gastroesophageal reflux disease)   . GERD 07/17/2010  . DIVERTICULOSIS-COLON 07/17/2010    Past Surgical History:  Procedure Laterality Date  . ACL replacement    . jaw joint replacement    . VAGINAL HYSTERECTOMY      OB History    No data available       Home Medications    Prior to Admission medications   Medication Sig Start Date End Date Taking? Authorizing Provider  ALPRAZolam (XANAX) 0.25 MG tablet Take 1 tablet (0.25 mg total) by mouth as needed for sleep. Very rarely uses 09/02/15    Dorena Bodo, PA-C  dicyclomine (BENTYL) 20 MG tablet Take 1 tablet (20 mg total) by mouth 2 (two) times daily. 01/14/16   Everlene Farrier, PA-C  HYDROcodone-homatropine (HYCODAN) 5-1.5 MG/5ML syrup Take 5 mLs by mouth every 8 (eight) hours as needed for cough. 09/02/15   Patriciaann Clan [place], PA-C  Lactobacillus (ACIDOPHILUS PROBIOTIC) 10 MG TABS Take 10 mg by mouth 3 (three) times daily. 01/14/16   Everlene Farrier, PA-C  ondansetron (ZOFRAN ODT) 4 MG disintegrating tablet Take 1 tablet (4 mg total) by mouth every 8 (eight) hours as needed for nausea or vomiting. 01/14/16   Everlene Farrier, PA-C    Family History Family History  Problem Relation Age of Onset  . Cholecystitis Mother   . Cholecystitis Sister     Social History Social History  Substance Use Topics  . Smoking status: Former Games developer  . Smokeless tobacco: Not on file  . Alcohol use 4.7 oz/week    1 Standard drinks or equivalent, 7 Glasses of wine per week     Allergies   Sulfonamide derivatives   Review of Systems Review of Systems  Constitutional: Negative for chills and fever.  HENT: Negative for congestion and sore throat.   Eyes: Negative for visual disturbance.  Respiratory: Negative for cough and shortness of breath.   Cardiovascular: Negative for chest pain.  Gastrointestinal: Positive for abdominal pain, diarrhea, nausea and vomiting. Negative for blood in stool.  Genitourinary: Negative  for difficulty urinating, dysuria, frequency, urgency, vaginal bleeding and vaginal discharge.  Musculoskeletal: Negative for back pain and neck pain.  Skin: Negative for rash.  Neurological: Negative for headaches.     Physical Exam Updated Vital Signs BP 159/84   Pulse 76   Temp 98.8 F (37.1 C) (Oral)   Resp 21   SpO2 99%   Physical Exam  Constitutional: She appears well-developed and well-nourished. No distress.  Nontoxic appearing.  HENT:  Head: Normocephalic and atraumatic.  Mouth/Throat: Oropharynx is clear and moist.   Mucous membranes are moist.  Eyes: Conjunctivae are normal. Pupils are equal, round, and reactive to light. Right eye exhibits no discharge. Left eye exhibits no discharge.  Neck: Neck supple.  Cardiovascular: Normal rate, regular rhythm, normal heart sounds and intact distal pulses.  Exam reveals no gallop and no friction rub.   No murmur heard. Pulmonary/Chest: Effort normal and breath sounds normal. No respiratory distress. She has no wheezes. She has no rales.  Abdominal: Soft. Bowel sounds are normal. She exhibits no distension and no mass. There is tenderness. There is no rebound and no guarding.  Abdomen is soft. Bowel sounds are present. Epigastric, left and right upper quadrant abdominal tenderness to palpation. No peritoneal signs. No CVA or flank tenderness.  Musculoskeletal: She exhibits no edema.  Lymphadenopathy:    She has no cervical adenopathy.  Neurological: She is alert. Coordination normal.  Skin: Skin is warm and dry. Capillary refill takes less than 2 seconds. No rash noted. She is not diaphoretic. No erythema. No pallor.  Psychiatric: She has a normal mood and affect. Her behavior is normal.  Nursing note and vitals reviewed.    ED Treatments / Results  Labs (all labs ordered are listed, but only abnormal results are displayed) Labs Reviewed  COMPREHENSIVE METABOLIC PANEL - Abnormal; Notable for the following:       Result Value   Glucose, Bld 147 (*)    BUN 23 (*)    Calcium 8.8 (*)    All other components within normal limits  CBC - Abnormal; Notable for the following:    WBC 18.1 (*)    All other components within normal limits  LIPASE, BLOOD  URINALYSIS, ROUTINE W REFLEX MICROSCOPIC (NOT AT [facility])    EKG  EKG Interpretation  Date/Time:  Tuesday January 14 2016 09:34:23 EDT Ventricular Rate:  75 PR Interval:    QRS Duration: 99 QT Interval:  434 QTC Calculation: 485 R Axis:   75 Text Interpretation:  Sinus rhythm No previous tracing Confirmed  by Denton LankSTEINL  MD, Caryn BeeKEVIN ([id number]) on 01/14/2016 9:48:26 AM       Radiology Ct Abdomen Pelvis W Contrast  Result Date: 01/14/2016 CLINICAL DATA:  Upper abdominal pain, nausea and vomiting starting this morning, status post hysterectomy EXAM: CT ABDOMEN AND PELVIS WITH CONTRAST TECHNIQUE: Multidetector CT imaging of the abdomen and pelvis was performed using the standard protocol following bolus administration of intravenous contrast. CONTRAST:  100mL ISOVUE-300 IOPAMIDOL (ISOVUE-300) INJECTION 61% COMPARISON:  03/14/2010 FINDINGS: Lower chest:  The lung bases are unremarkable. Hepatobiliary: No calcified gallstones are noted within gallbladder. No focal hepatic mass. Pancreas: Enhanced pancreas is unremarkable. Spleen: Enhanced spleen is unremarkable. Adrenals/Urinary Tract: No adrenal gland mass. Enhanced kidneys are symmetrical in size. No hydronephrosis or hydroureter. 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 Delayed renal images shows bilateral renal symmetrical excretion. Bilateral visualized proximal ureter is unremarkable. The urinary bladder is unremarkable. Stomach/Bowel:  There is no gastric outlet obstruction. No small bowel obstruction. The terminal ileum is unremarkable. Normal appendix is noted. No pericecal inflammation. No evidence of colitis or diverticulitis. Few diverticula are noted descending colon and sigmoid colon. No distal colonic obstruction. Vascular/Lymphatic: No aortic aneurysm. No retroperitoneal or mesenteric adenopathy. Reproductive: The patient is status post hysterectomy. Other: No ascites or free abdominal air.  No inguinal adenopathy. Musculoskeletal: No destructive bony lesions are noted. Mild degenerative changes with anterior spurring lower thoracic spine. No acute fractures are noted. IMPRESSION: 1. There is no evidence of acute inflammatory process within abdomen. 2. No pericecal inflammation.  Normal appendix. 3. No small bowel or colonic  obstruction. 4. Status post hysterectomy. Electronically Signed   By: Natasha Mead M.D.   On: 01/14/2016 12:26   US Abdomen Limited  Result Date: 01/14/2016 CLINICAL DATA:  Right upper quadrant pain since last night EXAM: US ABDOMEN LIMITED - RIGHT UPPER QUADRANT COMPARISON:  CT 03/14/2010 FINDINGS: Gallbladder: No gallstones or wall thickening visualized. No sonographic Murphy sign noted by sonographer. Common bile duct: Diameter: Normal caliber, 5 mm Liver: No focal lesion identified. Within normal limits in parenchymal echogenicity. IMPRESSION: Unremarkable right upper quadrant ultrasound. Electronically Signed   By: Charlett Nose M.D.   On: 01/14/2016 10:28    Procedures Procedures (including critical care time)  Medications Ordered in ED Medications  sodium chloride 0.9 % bolus 1,000 mL (0 mLs Intravenous Stopped 01/14/16 1125)  ondansetron (ZOFRAN) injection 4 mg (4 mg Intravenous Given 01/14/16 0926)  morphine 4 MG/ML injection 4 mg (4 mg Intravenous Given 01/14/16 0927)  sodium chloride 0.9 % bolus 1,000 mL (0 mLs Intravenous Stopped 01/14/16 1221)  ondansetron (ZOFRAN) injection 4 mg (4 mg Intravenous Given 01/14/16 1150)  morphine 4 MG/ML injection 4 mg (4 mg Intravenous Given 01/14/16 1150)  iopamidol (ISOVUE-300) 61 % injection (100 mLs  Contrast Given 01/14/16 1203)  dicyclomine (BENTYL) capsule 10 mg (10 mg Oral Given 01/14/16 1301)     Initial Impression / Assessment and Plan / ED Course  I have reviewed the triage vital signs and the nursing notes.  Pertinent labs & imaging results that were available during my care of the patient were reviewed by me and considered in my medical decision making (see chart for details).  Clinical Course    This  is a 60 y.o. Female who presents to the ED complaining of nausea, vomiting, diarrhea and abdominal pain since 12:30 this morning. Patient reports she woke up around 12:30 this morning with nausea and vomiting. She reports this then progressed to  diarrhea as well as upper abdominal pain. She reports pain across her upper abdomen. Multiple episodes of diarrhea and vomiting today. On exam the patient is afebrile and nontoxic appearing. Her abdomen is soft and she has tenderness diffusely to her upper abdomen. No focal tenderness. No peritoneal signs.  Urinalysis shows no sign of infection. Lipase is within normal limits. CMP is unremarkable. CBC is remarkable only for a white count of 18,000. Right upper quadrant ultrasound was unremarkable. After patient returned from ultrasound she began to have symptoms again. She began having abdominal pain and vomiting again. Will obtain CT abdomen and pelvis with contrast due to her leukocytosis and continued pain. CT abdomen and pelvis with contrast shows no evidence of acute inflammatory process within the abdomen. Normal appendix. No bowel obstruction. I had rechecked patient reports she is feeling better. She is tolerated Bentyl without nausea or vomiting. She reports the Bentyl has subsided  her abdominal pain. She is tolerating PO. She is feeling ready for discharge. Workup here is reassuring. I discussed food choices to help with diarrhea and vomiting. Will discharge with prescriptions for Zofran, Bentyl and a probiotic. I advised if she continues to have symptoms 48 hours from now, has fevers or worsening or new symptoms she needs to return to the emergency department. I discussed strict and specific return precautions. I advised the patient to follow-up with their primary care provider this week. I advised the patient to return to the emergency department with new or worsening symptoms or new concerns. The patient verbalized understanding and agreement with plan.     Final Clinical Impressions(s) / ED Diagnoses   Final diagnoses:  Nausea vomiting and diarrhea  Upper abdominal pain    New Prescriptions New Prescriptions   DICYCLOMINE (BENTYL) 20 MG TABLET    Take 1 tablet (20 mg total) by mouth 2  (two) times daily.   LACTOBACILLUS (ACIDOPHILUS PROBIOTIC) 10 MG TABS    Take 10 mg by mouth 3 (three) times daily.   ONDANSETRON (ZOFRAN ODT) 4 MG DISINTEGRATING TABLET    Take 1 tablet (4 mg total) by mouth every 8 (eight) hours as needed for nausea or vomiting.     Everlene Farrier, PA-C 01/14/16 1338    Cathren Laine, MD 01/14/16 [phone]

## 2016-02-03 DIAGNOSIS — R0981 Nasal congestion: Secondary | ICD-10-CM | POA: Diagnosis not present

## 2016-02-03 DIAGNOSIS — R350 Frequency of micturition: Secondary | ICD-10-CM | POA: Diagnosis not present

## 2016-06-24 ENCOUNTER — Encounter: Payer: Self-pay | Admitting: Family Medicine

## 2016-06-24 ENCOUNTER — Ambulatory Visit (INDEPENDENT_AMBULATORY_CARE_PROVIDER_SITE_OTHER): Payer: 59 | Admitting: Family Medicine

## 2016-06-24 VITALS — BP 112/64 | HR 70 | Temp 98.7°F | Resp 14 | Ht 64.0 in | Wt 156.0 lb

## 2016-06-24 DIAGNOSIS — R0981 Nasal congestion: Secondary | ICD-10-CM

## 2016-06-24 DIAGNOSIS — J3489 Other specified disorders of nose and nasal sinuses: Secondary | ICD-10-CM

## 2016-06-24 DIAGNOSIS — N390 Urinary tract infection, site not specified: Secondary | ICD-10-CM

## 2016-06-24 DIAGNOSIS — Z23 Encounter for immunization: Secondary | ICD-10-CM

## 2016-06-24 LAB — URINALYSIS, MICROSCOPIC ONLY
CRYSTALS: NONE SEEN [HPF]
Casts: NONE SEEN [LPF]
YEAST: NONE SEEN [HPF]

## 2016-06-24 LAB — URINALYSIS, ROUTINE W REFLEX MICROSCOPIC
BILIRUBIN URINE: NEGATIVE
GLUCOSE, UA: NEGATIVE
Nitrite: NEGATIVE
PROTEIN: NEGATIVE
Specific Gravity, Urine: 1.02 (ref 1.001–1.035)
pH: 6 (ref 5.0–8.0)

## 2016-06-24 MED ORDER — FLUTICASONE PROPIONATE 50 MCG/ACT NA SUSP: 2.0000 | Freq: Every day | NASAL | 6 refills | Status: DC

## 2016-06-24 MED ORDER — CEPHALEXIN 500 MG PO CAPS: 500.0000 mg | ORAL_CAPSULE | Freq: Two times a day (BID) | ORAL | 0 refills | Status: DC

## 2016-06-24 NOTE — Patient Instructions (Signed)
Use anti-histamin xyzal Use flonase Antibiotics for urine , we will call with culture results F/U as needed

## 2016-06-24 NOTE — Progress Notes (Signed)
   Subjective:    Patient ID: Lauren Byrd, female    DOB: 06/18/1955, 61 y.o.   MRN: [id number]  Patient presents for Nasal Congestion (x months- states that she has been having multiple episodes of head congestion, sinus pressure, nasal draiange) and Dysuria (x2 days- r flank pain, bladder fullness) Patient here with nasal congestion and drainage on and off she's had multiple episodes over the past month or so. She's been taking vitamin C and other immune health vitamins. She was treated for sinus infection with antibiotics about 3 weeks ago her symptoms cleared. She's not had any fever no significant cough but still has the drainage and congestion nose.  For the past 2 days she's had some right flank pain and fullness in her bladder. She tends to build this way prior to her urinary tract infections. She denies any blood in the urine denies any fever no nausea vomiting no abdominal pain.      Review Of Systems:  GEN- denies fatigue, fever, weight loss,weakness, recent illness HEENT- denies eye drainage, change in vision, +nasal discharge, CVS- denies chest pain, palpitations RESP- denies SOB, cough, wheeze ABD- denies N/V, change in stools, abd pain GU- + dysuria, denies hematuria, dribbling, incontinence MSK- denies joint pain, muscle aches, injury Neuro- denies headache, dizziness, syncope, seizure activity       Objective:    BP 112/64   Pulse 70   Temp 98.7 F (37.1 C) (Oral)   Resp 14   Ht 5\' 4"  (1.626 m)   Wt 156 lb (70.8 kg)   SpO2 98%   BMI 26.78 kg/m  GEN- NAD, alert and oriented x3 HEENT- PERRL, EOMI, non injected sclera, pink conjunctiva, MMM, oropharynx clear, nares congested, clear rhinorrhea, no maxillary sinus tenderness, TM clear bilat no effusion  Neck- Supple, no  LAD CVS- RRR, no murmur RESP-CTAB ABD-NABS,soft,NT,ND, no CVA tenerness  Pulses- Radial  2+        Assessment & Plan:      Problem List Items Addressed This Visit    None    Visit  Diagnoses    Urinary tract infection without hematuria, site unspecified    -  Primary   Start Keflex culture sent   Relevant Medications   cephALEXin (KEFLEX) 500 MG capsule   Other Relevant Orders   Urinalysis, Routine w reflex microscopic (Completed)   Urine culture   Nasal congestion with rhinorrhea       More recurrent congestion with rhinorrhea. Was Her use Xyzal given her samplesshe will also use Flonase. No fevers on a true sinus infection today.   Need for prophylactic vaccination and inoculation against influenza       Request flu shot   Relevant Orders   Flu Vaccine QUAD 36+ mos PF IM (Fluarix & Fluzone Quad PF) (Completed)      Note: This dictation was prepared with Dragon dictation along with smaller phrase technology. Any transcriptional errors that result from this process are unintentional.

## 2016-06-25 LAB — URINE CULTURE: ORGANISM ID, BACTERIA: NO GROWTH

## 2016-09-09 IMAGING — CT CT ABD-PELV W/ CM
2 of 5 series · 16 of 46 positions shown, 18 images · IV contrast (Omni 300)
Comparison: 03/14/2010

CLINICAL DATA: Upper abdominal pain, nausea and vomiting starting
this morning, status post hysterectomy

EXAM:
CT ABDOMEN AND PELVIS WITH CONTRAST
TECHNIQUE: Multidetector CT imaging of the abdomen and pelvis was performed
using the standard protocol following bolus administration of
intravenous contrast.
CONTRAST:  100mL C4EU2Y-VOO IOPAMIDOL (C4EU2Y-VOO) INJECTION 61%

[Series 2: a/p w/ 5mm · axial · 0.94mm/px · z∈[-487,-72]mm · 13 of 95 slices shown, 15 images]
[im 6/95  soft-tissue]
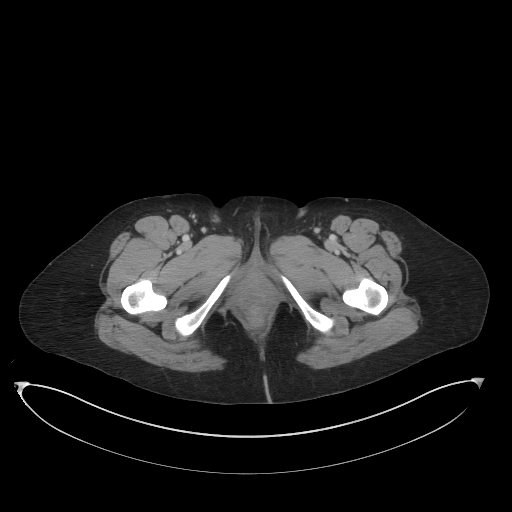
[im 6/95  bone]
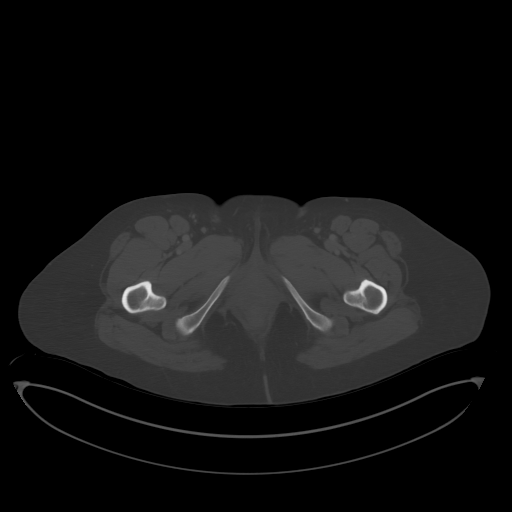
[im 12/95  soft-tissue]
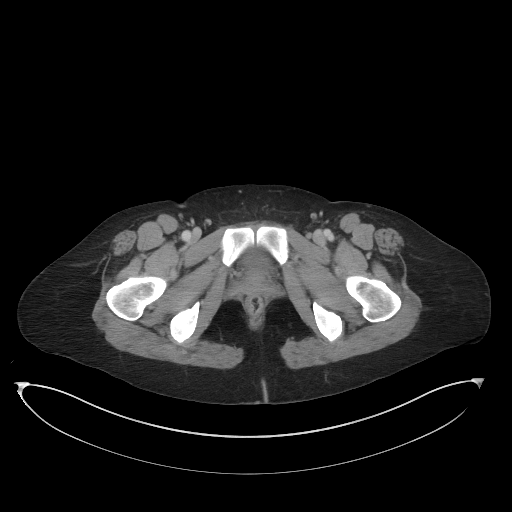
[im 23/95  soft-tissue]
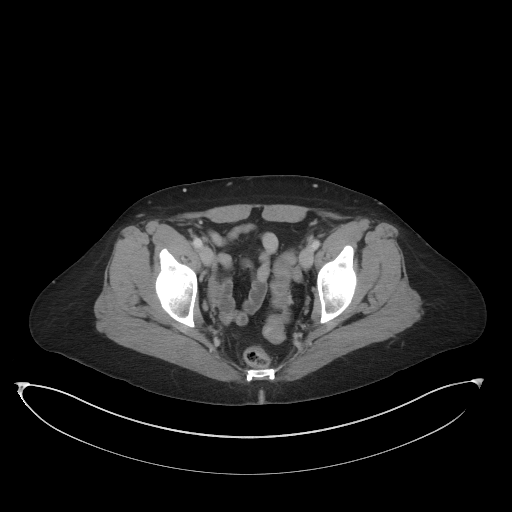
[im 28/95  soft-tissue]
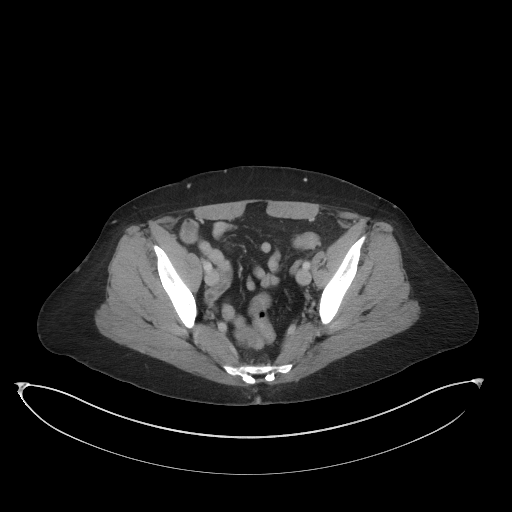
[im 34/95  soft-tissue]
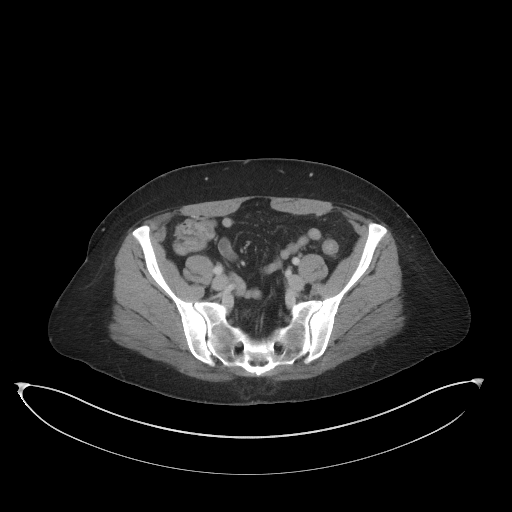
[im 39/95  soft-tissue]
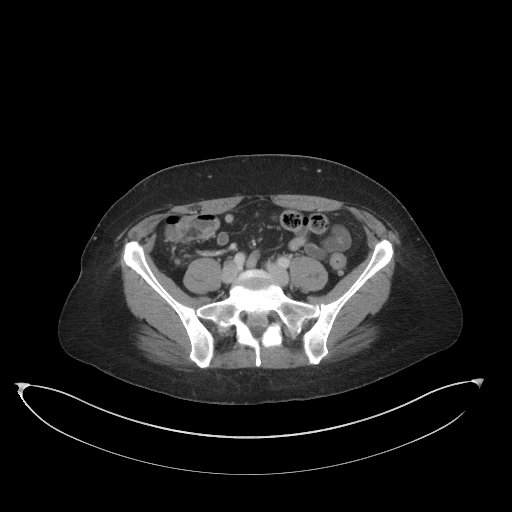
[im 50/95  soft-tissue]
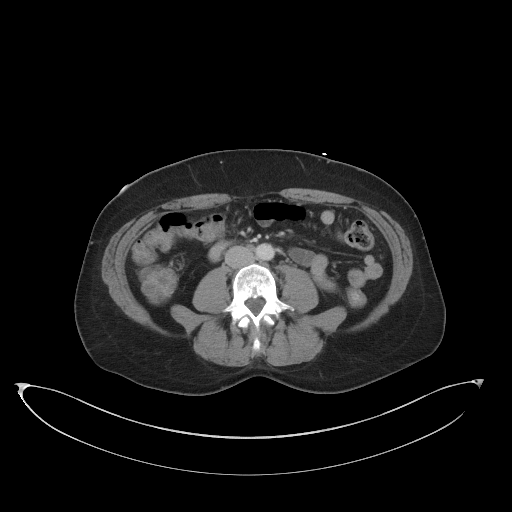
[im 56/95  soft-tissue]
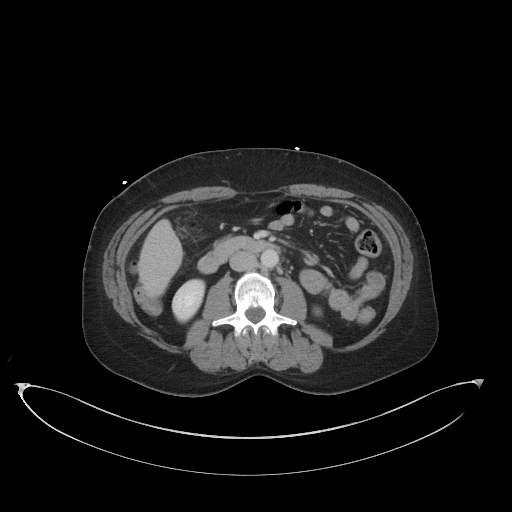
[im 61/95  soft-tissue]
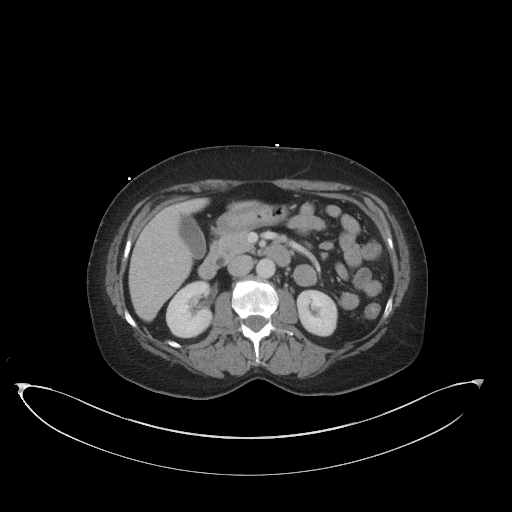
[im 61/95  bone]
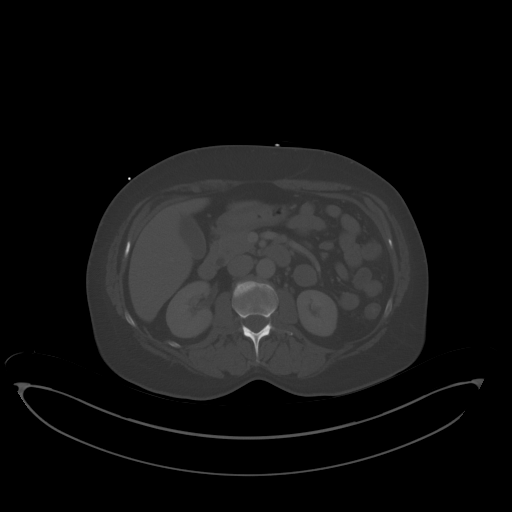
[im 67/95  soft-tissue]
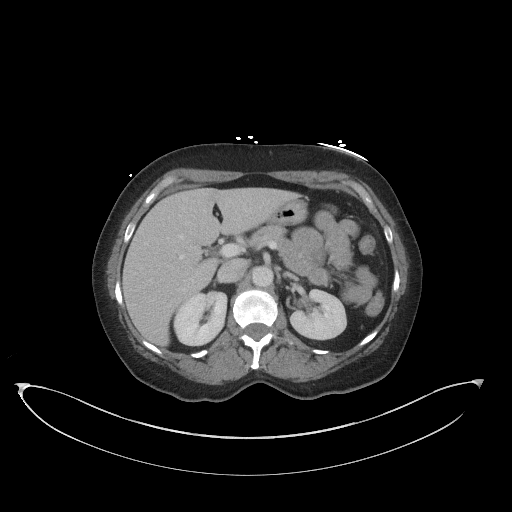
[im 72/95  soft-tissue]
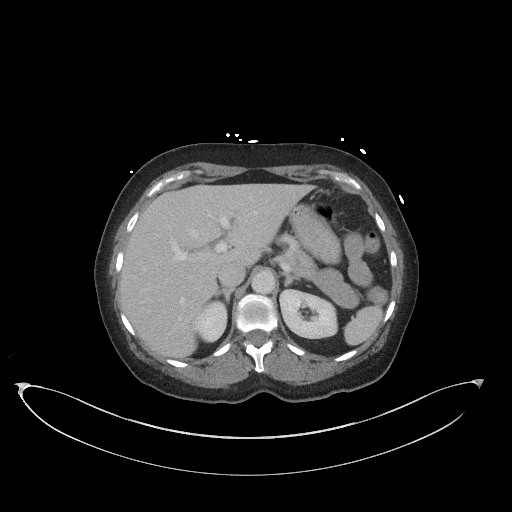
[im 83/95  soft-tissue]
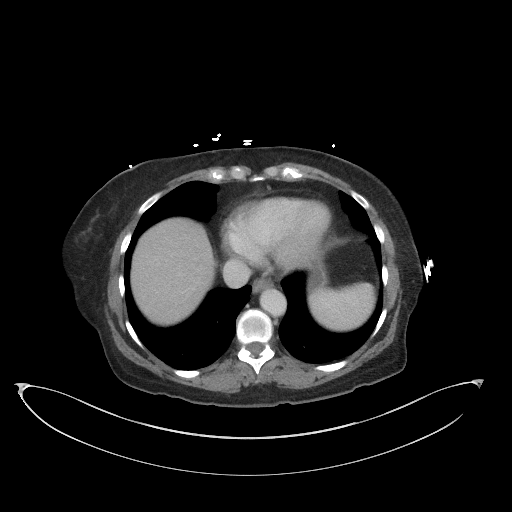
[im 89/95  soft-tissue]
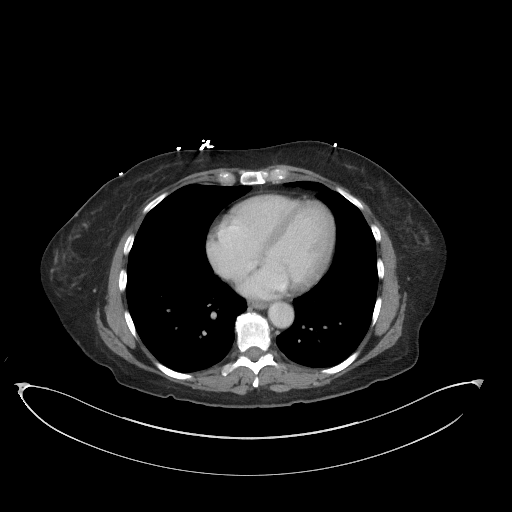

[Series 5: a/p w/ cor · coronal · 0.74mm/px · 3 of 132 slices shown]
[im 44/132  soft-tissue]
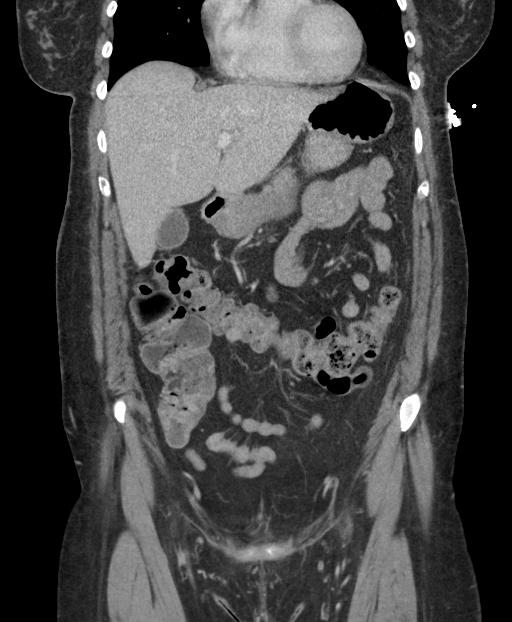
[im 59/132  soft-tissue]
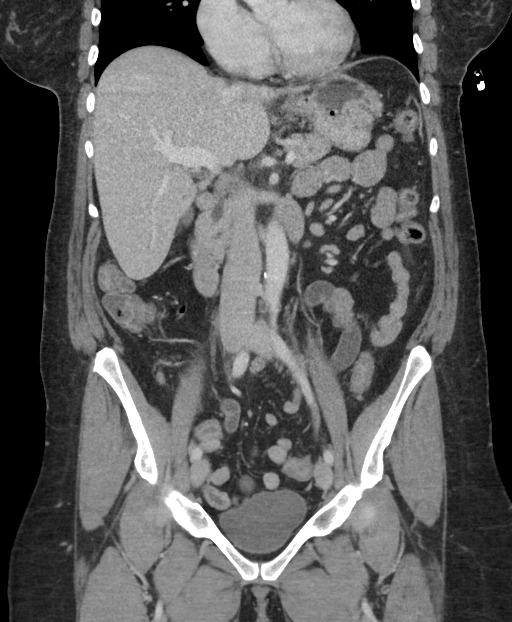
[im 73/132  soft-tissue]
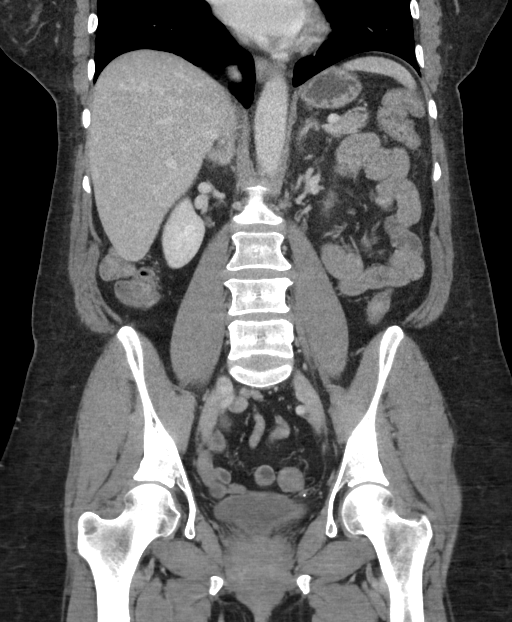

[16 of 46 positions shown; findings below may reference images not displayed]

FINDINGS: Lower chest:  The lung bases are unremarkable.

Hepatobiliary: No calcified gallstones are noted within gallbladder.
No focal hepatic mass.

Pancreas: Enhanced pancreas is unremarkable.

Spleen: Enhanced spleen is unremarkable.

Adrenals/Urinary Tract: No adrenal gland mass. Enhanced kidneys are
symmetrical in size. No hydronephrosis or hydroureter. There is a
probable lipoma in lower pole of the left kidney measures 6 mm.
Delayed renal images shows bilateral renal symmetrical excretion.
Bilateral visualized proximal ureter is unremarkable.

The urinary bladder is unremarkable.

Stomach/Bowel: There is no gastric outlet obstruction. No small
bowel obstruction. The terminal ileum is unremarkable. Normal
appendix is noted. No pericecal inflammation. No evidence of colitis
or diverticulitis. Few diverticula are noted descending colon and
sigmoid colon. No distal colonic obstruction.

Vascular/Lymphatic: No aortic aneurysm. No retroperitoneal or
mesenteric adenopathy.

Reproductive: The patient is status post hysterectomy.

Other: No ascites or free abdominal air.  No inguinal adenopathy.

Musculoskeletal: No destructive bony lesions are noted. Mild
degenerative changes with anterior spurring lower thoracic spine. No
acute fractures are noted.
IMPRESSION: 1. There is no evidence of acute inflammatory process within
abdomen.
2. No pericecal inflammation.  Normal appendix.
3. No small bowel or colonic obstruction.
4. Status post hysterectomy.

## 2016-11-13 DIAGNOSIS — Z23 Encounter for immunization: Secondary | ICD-10-CM | POA: Diagnosis not present

## 2016-11-13 DIAGNOSIS — Z13220 Encounter for screening for lipoid disorders: Secondary | ICD-10-CM | POA: Diagnosis not present

## 2016-11-13 DIAGNOSIS — Z0000 Encounter for general adult medical examination without abnormal findings: Secondary | ICD-10-CM | POA: Diagnosis not present

## 2016-11-13 DIAGNOSIS — R7309 Other abnormal glucose: Secondary | ICD-10-CM | POA: Diagnosis not present

## 2017-02-01 DIAGNOSIS — R05 Cough: Secondary | ICD-10-CM | POA: Diagnosis not present

## 2017-02-01 DIAGNOSIS — Z23 Encounter for immunization: Secondary | ICD-10-CM | POA: Diagnosis not present

## 2017-05-13 DIAGNOSIS — K219 Gastro-esophageal reflux disease without esophagitis: Secondary | ICD-10-CM | POA: Diagnosis not present

## 2017-05-13 DIAGNOSIS — R51 Headache: Secondary | ICD-10-CM | POA: Diagnosis not present

## 2017-06-17 DIAGNOSIS — Z01419 Encounter for gynecological examination (general) (routine) without abnormal findings: Secondary | ICD-10-CM | POA: Diagnosis not present

## 2017-06-17 DIAGNOSIS — Z124 Encounter for screening for malignant neoplasm of cervix: Secondary | ICD-10-CM | POA: Diagnosis not present

## 2017-06-22 ENCOUNTER — Other Ambulatory Visit: Payer: Self-pay | Admitting: Obstetrics and Gynecology

## 2017-06-22 DIAGNOSIS — E2839 Other primary ovarian failure: Secondary | ICD-10-CM

## 2017-08-04 ENCOUNTER — Ambulatory Visit
Admission: RE | Admit: 2017-08-04 | Discharge: 2017-08-04 | Disposition: A | Discharge status: Home / Self Care | Payer: 59 | Source: Ambulatory Visit | Attending: Obstetrics and Gynecology | Admitting: Obstetrics and Gynecology

## 2017-08-04 DIAGNOSIS — M85852 Other specified disorders of bone density and structure, left thigh: Secondary | ICD-10-CM | POA: Diagnosis not present

## 2017-08-04 DIAGNOSIS — E2839 Other primary ovarian failure: Secondary | ICD-10-CM

## 2017-08-04 DIAGNOSIS — Z780 Asymptomatic menopausal state: Secondary | ICD-10-CM | POA: Diagnosis not present

## 2017-08-06 DIAGNOSIS — H04121 Dry eye syndrome of right lacrimal gland: Secondary | ICD-10-CM | POA: Diagnosis not present

## 2017-08-12 ENCOUNTER — Other Ambulatory Visit: Payer: Self-pay | Admitting: *Deleted

## 2017-08-12 MED ORDER — FLUTICASONE PROPIONATE 50 MCG/ACT NA SUSP: 2.0000 | Freq: Every day | NASAL | 0 refills | Status: DC

## 2017-09-12 ENCOUNTER — Other Ambulatory Visit: Payer: Self-pay | Admitting: Family Medicine

## 2017-09-16 ENCOUNTER — Other Ambulatory Visit: Payer: Self-pay | Admitting: Family Medicine

## 2017-09-26 ENCOUNTER — Other Ambulatory Visit: Payer: Self-pay | Admitting: Family Medicine

## 2017-11-30 DIAGNOSIS — R7309 Other abnormal glucose: Secondary | ICD-10-CM | POA: Diagnosis not present

## 2017-11-30 DIAGNOSIS — Z13220 Encounter for screening for lipoid disorders: Secondary | ICD-10-CM | POA: Diagnosis not present

## 2017-11-30 DIAGNOSIS — Z0000 Encounter for general adult medical examination without abnormal findings: Secondary | ICD-10-CM | POA: Diagnosis not present

## 2017-12-27 ENCOUNTER — Other Ambulatory Visit: Payer: Self-pay | Admitting: Family Medicine

## 2018-01-13 ENCOUNTER — Other Ambulatory Visit: Payer: Self-pay | Admitting: Family Medicine

## 2018-01-13 ENCOUNTER — Encounter: Payer: Self-pay | Admitting: Internal Medicine

## 2018-01-13 DIAGNOSIS — K219 Gastro-esophageal reflux disease without esophagitis: Secondary | ICD-10-CM | POA: Diagnosis not present

## 2018-01-13 DIAGNOSIS — R197 Diarrhea, unspecified: Secondary | ICD-10-CM | POA: Diagnosis not present

## 2018-01-19 ENCOUNTER — Ambulatory Visit
Admission: RE | Admit: 2018-01-19 | Discharge: 2018-01-19 | Disposition: A | Discharge status: Home / Self Care | Payer: 59 | Source: Ambulatory Visit | Attending: Family Medicine | Admitting: Family Medicine

## 2018-01-19 DIAGNOSIS — K219 Gastro-esophageal reflux disease without esophagitis: Secondary | ICD-10-CM | POA: Diagnosis not present

## 2018-01-19 DIAGNOSIS — K209 Esophagitis, unspecified: Secondary | ICD-10-CM | POA: Diagnosis not present

## 2018-01-24 DIAGNOSIS — K219 Gastro-esophageal reflux disease without esophagitis: Secondary | ICD-10-CM | POA: Diagnosis not present

## 2018-01-24 DIAGNOSIS — R070 Pain in throat: Secondary | ICD-10-CM | POA: Diagnosis not present

## 2018-02-01 DIAGNOSIS — K319 Disease of stomach and duodenum, unspecified: Secondary | ICD-10-CM | POA: Diagnosis not present

## 2018-02-01 DIAGNOSIS — K219 Gastro-esophageal reflux disease without esophagitis: Secondary | ICD-10-CM | POA: Diagnosis not present

## 2018-02-01 DIAGNOSIS — R12 Heartburn: Secondary | ICD-10-CM | POA: Diagnosis not present

## 2018-02-01 DIAGNOSIS — K210 Gastro-esophageal reflux disease with esophagitis: Secondary | ICD-10-CM | POA: Diagnosis not present

## 2018-02-24 ENCOUNTER — Ambulatory Visit: Payer: 59 | Admitting: Internal Medicine

## 2018-02-28 DIAGNOSIS — K219 Gastro-esophageal reflux disease without esophagitis: Secondary | ICD-10-CM | POA: Diagnosis not present

## 2018-03-20 ENCOUNTER — Other Ambulatory Visit: Payer: Self-pay | Admitting: Family Medicine

## 2018-03-28 DIAGNOSIS — K219 Gastro-esophageal reflux disease without esophagitis: Secondary | ICD-10-CM | POA: Diagnosis not present

## 2018-08-04 ENCOUNTER — Other Ambulatory Visit: Payer: Self-pay

## 2018-08-04 ENCOUNTER — Encounter: Payer: Self-pay | Admitting: Family Medicine

## 2018-08-04 ENCOUNTER — Ambulatory Visit (INDEPENDENT_AMBULATORY_CARE_PROVIDER_SITE_OTHER): Payer: BLUE CROSS/BLUE SHIELD | Admitting: Family Medicine

## 2018-08-04 DIAGNOSIS — Z01419 Encounter for gynecological examination (general) (routine) without abnormal findings: Secondary | ICD-10-CM | POA: Diagnosis not present

## 2018-08-04 DIAGNOSIS — F419 Anxiety disorder, unspecified: Secondary | ICD-10-CM | POA: Diagnosis not present

## 2018-08-04 DIAGNOSIS — Z6829 Body mass index (BMI) 29.0-29.9, adult: Secondary | ICD-10-CM | POA: Diagnosis not present

## 2018-08-04 DIAGNOSIS — Z1231 Encounter for screening mammogram for malignant neoplasm of breast: Secondary | ICD-10-CM | POA: Diagnosis not present

## 2018-08-04 MED ORDER — ALPRAZOLAM 0.25 MG PO TABS: 0.2500 mg | ORAL_TABLET | Freq: Every evening | ORAL | 1 refills | Status: DC | PRN

## 2018-08-04 NOTE — Progress Notes (Signed)
Patient ID: Lauren Byrd, female    DOB: [birth year], 63 y.o.   MRN: [id number]  PCP: Lupita Raider, MD  Chief Complaint  Patient presents with   Anxiety/ Panic Attacks    insomnia, feels like she can't get a deep breath, as long as she's working she is ok    Subjective:   Lauren Byrd is a 63 y.o. female, presents to clinic with CC of panic attacks.  Her sx have been very intermittent, she still has a prescriptions from 2017, very rarely takes anxiolytics but her symptoms have worsened recently with coated pandemic, being isolated more to her home, and being able to work less.  She is trying to stay busy with family members while having difficulty avoiding aggitation and annoyance with each other.    She is a former [facility] patient and is here to establish with alternate provider in clinic.    She request refill on xanax - takes 0.25 mg dose, very rarely only at bedtime to help her get to sleep when anxious and her mind runs and she worries.  GAD 7 : Generalized Anxiety Score 08/04/2018  Nervous, Anxious, on Edge 2  Control/stop worrying 1  Worry too much - different things 1  Trouble relaxing 2  Restless 2  Easily annoyed or irritable 0  Afraid - awful might happen 2  Total GAD 7 Score 10  Anxiety Difficulty Very difficult   Depression screen [facility] 2/9 08/04/2018  Decreased Interest 0  Down, Depressed, Hopeless 0  PHQ - 2 Score 0  Altered sleeping 3  Tired, decreased energy 3  Change in appetite 0  Feeling bad or failure about yourself  0  Trouble concentrating 0  Moving slowly or fidgety/restless 0  Suicidal thoughts 0  PHQ-9 Score 6  Difficult doing work/chores Not difficult at all   She denies SI, HI AVH.   Patient Active Problem List   Diagnosis Date Noted   Anxiety    Arthritis    GERD (gastroesophageal reflux disease)    GERD 07/17/2010   DIVERTICULOSIS-COLON 07/17/2010     Prior to Admission medications   Medication Sig Start Date End Date  Taking? Authorizing Provider  ALPRAZolam (XANAX) 0.25 MG tablet Take 1 tablet (0.25 mg total) by mouth as needed for sleep. Very rarely uses 09/02/15  Yes Dixon, Patriciaann Clan, PA-C  dexlansoprazole (DEXILANT) 60 MG capsule Take 60 mg by mouth daily.   Yes [provider]  ELDERBERRY PO Take by mouth.   Yes [provider]  fluticasone (FLONASE) 50 MCG/ACT nasal spray SPRAY 2 SPRAYS INTO EACH NOSTRIL EVERY DAY NEEDS OFFICE VISIT FOR REFILLS 03/22/18  Yes [place], Velna Hatchet, MD  Multiple Vitamin (MULTIVITAMIN WITH MINERALS) TABS tablet Take 1 tablet by mouth daily.   Yes [provider]     Allergies  Allergen Reactions   Sulfonamide Derivatives     REACTION: Hives     Family History  Problem Relation Age of Onset   Cholecystitis Mother    Cholecystitis Sister      Social History   Socioeconomic History   Marital status: Married    Spouse name: Not on file   Number of children: Not on file   Years of education: Not on file   Highest education level: Not on file  Occupational History   Not on file  Social Needs   Financial resource strain: Not on file   Food insecurity:    Worry: Not on file  Inability: Not on file   Transportation needs:    Medical: Not on file    Non-medical: Not on file  Tobacco Use   Smoking status: Former Smoker   Smokeless tobacco: Never Used  Substance and Sexual Activity   Alcohol use: Yes    Alcohol/week: 8.0 standard drinks    Types: 7 Glasses of wine, 1 Standard drinks or equivalent per week   Drug use: No   Sexual activity: Yes  Lifestyle   Physical activity:    Days per week: Not on file    Minutes per session: Not on file   Stress: Not on file  Relationships   Social connections:    Talks on phone: Not on file    Gets together: Not on file    Attends religious service: Not on file    Active member of club or organization: Not on file    Attends meetings of clubs or organizations: Not on  file    Relationship status: Not on file   Intimate partner violence:    Fear of current or ex partner: Not on file    Emotionally abused: Not on file    Physically abused: Not on file    Forced sexual activity: Not on file  Other Topics Concern   Not on file  Social History Narrative   Not on file     Review of Systems  Constitutional: Negative.   HENT: Negative.   Eyes: Negative.   Respiratory: Negative.   Cardiovascular: Negative.   Gastrointestinal: Negative.   Endocrine: Negative.   Genitourinary: Negative.   Musculoskeletal: Negative.   Skin: Negative.   Allergic/Immunologic: Negative.   Neurological: Negative.   Hematological: Negative.   Psychiatric/Behavioral: Negative.   All other systems reviewed and are negative.      Objective:    Vitals:   08/04/18 1216  BP: 126/70  Pulse: 68  Resp: 16  Temp: 98 F (36.7 C)  TempSrc: Oral  SpO2: 100%  Weight: 171 lb (77.6 kg)  Height: 5' 4.17" (1.63 m)      Physical Exam Vitals signs and nursing note reviewed.  Constitutional:      General: She is not in acute distress.    Appearance: Normal appearance. She is well-developed. She is not ill-appearing, toxic-appearing or diaphoretic.  HENT:     Head: Normocephalic and atraumatic.     Nose: Nose normal.     Mouth/Throat:     Mouth: Mucous membranes are moist.     Pharynx: Oropharynx is clear.  Eyes:     General:        Right eye: No discharge.        Left eye: No discharge.     Conjunctiva/sclera: Conjunctivae normal.  Neck:     Musculoskeletal: Normal range of motion.     Trachea: No tracheal deviation.  Cardiovascular:     Rate and Rhythm: Normal rate and regular rhythm.     Pulses: Normal pulses.     Heart sounds: Normal heart sounds. No murmur. No friction rub. No gallop.   Pulmonary:     Effort: Pulmonary effort is normal. No respiratory distress.     Breath sounds: Normal breath sounds. No stridor. No wheezing, rhonchi or rales.    Musculoskeletal: Normal range of motion.     Right lower leg: No edema.     Left lower leg: No edema.  Skin:    General: Skin is warm and dry.     Findings: No  rash.  Neurological:     Mental Status: She is alert.     Motor: No abnormal muscle tone.     Coordination: Coordination normal.  Psychiatric:        Attention and Perception: Attention and perception normal.        Mood and Affect: Mood and affect normal.        Speech: Speech normal.        Behavior: Behavior is hyperactive. Behavior is not agitated, slowed, aggressive, withdrawn or combative. Behavior is cooperative.        Thought Content: Thought content normal.        Cognition and Memory: Cognition and memory normal.        Judgment: Judgment normal.           Assessment & Plan:      ICD-10-CM   1. Anxiety F41.9 ALPRAZolam (XANAX) 0.25 MG tablet    DISCONTINUED: ALPRAZolam (XANAX) 0.25 MG tablet    Pt with hx of anxiety and sparingly use of xanax only at night.  Sx have worsened secondary to COVID-pandemic stressors - she would like to have a refill in case she needs over the next couple weeks of months.  Controlled substance database was accessed and verified that she has not had any controlled substances in the last 2 years.  We discussed benefits side effects and precautions with taking benzodiazepines including but not limited to dependence, withdrawal, sedation.  Patient verbalized understanding of the risk and benefit.  She has never taken multiple times a day, I did discuss with her at length that I would prefer to use alternative medicines to treat anxiety and if she seems to have heightened symptoms which require more frequent use of benzodiazepines would rather use a slightly longer acting alternative so she does not develop worsening anxiety rebound or agitation.  She is agreeable to plan.  Was encouraged to schedule well visit in the next 2-3 months.       Danelle BerryLeisa Coulson Wehner, PA-C 08/04/18 12:32 PM

## 2018-09-28 ENCOUNTER — Telehealth: Payer: Self-pay

## 2018-09-28 ENCOUNTER — Encounter: Payer: Self-pay | Admitting: Family Medicine

## 2018-09-28 ENCOUNTER — Other Ambulatory Visit: Payer: Self-pay

## 2018-09-28 ENCOUNTER — Ambulatory Visit (INDEPENDENT_AMBULATORY_CARE_PROVIDER_SITE_OTHER): Payer: BLUE CROSS/BLUE SHIELD | Admitting: Family Medicine

## 2018-09-28 DIAGNOSIS — F419 Anxiety disorder, unspecified: Secondary | ICD-10-CM | POA: Diagnosis not present

## 2018-09-28 MED ORDER — LORAZEPAM 0.5 MG PO TABS: 0.5000 mg | ORAL_TABLET | Freq: Two times a day (BID) | ORAL | 1 refills | Status: AC | PRN

## 2018-09-28 MED ORDER — SERTRALINE HCL 25 MG PO TABS: 25.0000 mg | ORAL_TABLET | Freq: Every day | ORAL | 0 refills | Status: DC

## 2018-09-28 NOTE — Progress Notes (Signed)
Patient ID: Lauren Byrd, female    DOB: Sep 15, 1955, 63 y.o.   MRN: [id number]  PCP: Danelle Berry, PA-C  Virtual Visit via Telephone  Phone visit arranged with Marsa Aris Bozza for 09/28/18 at  3:30 PM EDT  Services provided today were via telemedicine through telephone call. Start of phone call:  3:47 PM  I verified that I was speaking with the correct person using two identifiers. Patient reported their location during encounter was at home   Patient consented to telephone visit  I conducted telephone visit from [facility]  Referring Provider:   Danelle Berry, PA-C   All participants in encounter:  Myself and the patient   I discussed the limitations, risks, security and privacy concerns of performing an evaluation and management service by telephone and the availability of in person appointments. I also discussed with the patient that there may be a patient responsible charge related to this service. The patient expressed understanding and agreed to proceed.  Chief Complaint  Patient presents with  . Anxiety    Subjective:   Lauren Byrd is a 63 y.o. female,  with CC of worsening anxiety and aggitation  Patient states that she was able to recently go traveling to the beach and stay in a place there just to get out of town they were able to self isolate there, upon returned home a few days ago she came extremely stressed out and feels panicked and trapped.  She cannot stop the constant worry feelings of anxiety and panic.  She notes that her her daughter is pregnant and they are very concerned about that.  There is also recently a death of a close friend.  Last night she took her xanax and it had no effect she was up all night worrying, anxious, hyperactive.  Finds her self being agitated having slightly short temper.  She states that she is always very high energy and hyperactive but usually is in a good mood, with these multiple stressors she feels  that she needs a daily medication to help calm down her symptoms.  We did previously discuss this when we addressed her benzodiazepine medications which at the time she took very sparingly and occasionally and usually just for insomnia.  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  She does want to start some medications she is able to function better and get better sleep.  Denies any suicidal ideation, homicidal ideations, auditory visual hallucinations.  She does believe it is slightly situational but also the symptoms are so severe she is agreeable to starting a new medication    Patient Active Problem List   Diagnosis Date Noted  . Anxiety   . Arthritis   . GERD (gastroesophageal reflux disease)   . GERD 07/17/2010  . DIVERTICULOSIS-COLON 07/17/2010    Prior to Admission medications   Medication Sig Start Date End Date Taking? Authorizing Provider  ALPRAZolam (XANAX) 0.25 MG tablet Take 1-2 tablets (0.25-0.5 mg total) by mouth at bedtime as needed for sleep. Very rarely uses 08/04/18   Danelle Berry, PA-C  dexlansoprazole (DEXILANT) 60 MG capsule  Take 60 mg by mouth daily.    [provider]  ELDERBERRY PO Take by mouth.    [provider]  fluticasone (FLONASE) 50 MCG/ACT nasal spray SPRAY 2 SPRAYS INTO EACH NOSTRIL EVERY DAY NEEDS OFFICE VISIT FOR REFILLS 03/22/18   Salley Scarleturham, Kawanta F, MD  Multiple Vitamin (MULTIVITAMIN WITH MINERALS) TABS tablet Take 1 tablet by mouth daily.    [provider]    Allergies  Allergen Reactions  . Sulfonamide Derivatives     REACTION: Hives    Review of Systems  Constitutional: Negative.   HENT:  Negative.   Eyes: Negative.   Respiratory: Negative.   Cardiovascular: Negative.   Gastrointestinal: Negative.   Endocrine: Negative.   Genitourinary: Negative.   Musculoskeletal: Negative.   Skin: Negative.   Allergic/Immunologic: Negative.   Neurological: Negative.  Negative for dizziness, tremors, syncope, weakness and headaches.  Hematological: Negative.   Psychiatric/Behavioral: Positive for agitation and sleep disturbance. Negative for behavioral problems, confusion, decreased concentration, dysphoric mood, hallucinations, self-injury and suicidal ideas. The patient is nervous/anxious and is hyperactive.   All other systems reviewed and are negative.      Objective:    There were no vitals filed for this visit.    Physical Exam Neurological:     Mental Status: She is alert.  Psychiatric:        Attention and Perception: Attention normal.        Mood and Affect: Mood and affect normal.        Speech: Speech normal.        Behavior: Behavior normal. Behavior is cooperative.        Thought Content: Thought content does not include homicidal or suicidal ideation. Thought content does not include homicidal or suicidal plan.        Cognition and Memory: Cognition and memory normal.        Judgment: Judgment normal.     Limited physical exam due to telephone visit, patient's voice was clear, she speak in full and complete sentences, no audible wheeze or stridor.  Thought content in discussion was appropriate.       Assessment & Plan:     ICD-10-CM   1. Anxiety disorder, unspecified type F41.9 LORazepam (ATIVAN) 0.5 MG tablet    sertraline (ZOLOFT) 25 MG tablet   worsening sx, start zoloft 25, increase to 50mg  daily at bedtime, f/up 2-3 weeks, d/c xanax, ativan PRN sparingly      I discussed the assessment and treatment plan with the patient. The patient was provided an opportunity to ask questions and all were answered. The patient agreed with the plan and demonstrated  an understanding of the instructions.   The patient was advised to call back or seek an in-person evaluation if the symptoms worsen or if the condition fails to improve as anticipated.  Phone call concluded at 4:00 I provided 13 minutes of non-face-to-face time during this encounter.  Danelle BerryLeisa Shayne Deerman, PA-C 09/29/18 1:42 PM

## 2018-09-28 NOTE — Telephone Encounter (Signed)
Pt called about her anxiety. States that her last visit, you recommended giving her an Rx for extended release to help her through the day. Pt denied it but now is having a lot of issues and she needs that Rx. Pt also states that she had a rough day yesterday and could not sleep last night. Please advise.

## 2018-09-28 NOTE — Telephone Encounter (Signed)
Please schedule a phone visit so I can address this, thanks

## 2018-09-28 NOTE — Telephone Encounter (Signed)
Pt notified. Phone visit is scheduled.

## 2018-10-12 DIAGNOSIS — F419 Anxiety disorder, unspecified: Secondary | ICD-10-CM | POA: Diagnosis not present

## 2018-10-20 ENCOUNTER — Other Ambulatory Visit: Payer: Self-pay | Admitting: Family Medicine

## 2018-10-20 DIAGNOSIS — F419 Anxiety disorder, unspecified: Secondary | ICD-10-CM

## 2018-10-20 NOTE — Telephone Encounter (Signed)
Requested Prescriptions   Pending Prescriptions Disp Refills  . sertraline (ZOLOFT) 25 MG tablet [Pharmacy Med Name: SERTRALINE HCL 25 MG TABLET] 90 tablet 1    Sig: TAKE 1 TABLET BY MOUTH EVERY DAY   Last OV 09/28/2018 Last written 09/28/2018

## 2018-10-20 NOTE — Telephone Encounter (Signed)
Pt notified. Pt states that she did not request a refill and that she if fine with her meds.

## 2018-10-20 NOTE — Telephone Encounter (Signed)
Pt just got 60 pills, so I cannot refill until I know what she's taking, why she needs a refill so quickly and how she is doing with meds  Please have her schedule appointment for med refill

## 2018-10-20 NOTE — Telephone Encounter (Signed)
Awesome, thanks!  It didn't seem right.  Glad to hear she's doing well

## 2018-11-09 ENCOUNTER — Encounter: Payer: Self-pay | Admitting: Internal Medicine

## 2018-11-21 DIAGNOSIS — F419 Anxiety disorder, unspecified: Secondary | ICD-10-CM | POA: Diagnosis not present

## 2018-12-09 ENCOUNTER — Other Ambulatory Visit: Payer: Self-pay | Admitting: Family Medicine

## 2018-12-09 DIAGNOSIS — F419 Anxiety disorder, unspecified: Secondary | ICD-10-CM

## 2018-12-09 NOTE — Telephone Encounter (Signed)
Ok thanks 

## 2018-12-09 NOTE — Telephone Encounter (Signed)
No she just started this as a new medicine and she needs a quick telephone visit or OV discuss before refills.  If she agrees to phone visit, we can do that today and I would appreciate if you would call her and start that visit reviewing meds, weight, and get GAD 7 done.  (two new meds zoloft and ativan changed from xanax, was advised in May to do f/up appointment in 3 weeks).  thanks

## 2018-12-09 NOTE — Telephone Encounter (Signed)
Requested Prescriptions   Pending Prescriptions Disp Refills  . sertraline (ZOLOFT) 25 MG tablet [Pharmacy Med Name: SERTRALINE HCL 25 MG TABLET] 30 tablet 1    Sig: TAKE 1 TABLET BY MOUTH EVERY DAY    Last OV 09/28/2018  Last written 09/28/2018

## 2018-12-09 NOTE — Telephone Encounter (Signed)
Pt states she did not request a refill and that she is fine for now on meds.

## 2019-03-23 ENCOUNTER — Other Ambulatory Visit: Payer: Self-pay | Admitting: Family Medicine

## 2019-04-02 IMAGING — US US ABDOMEN LIMITED
1 series · 14 of 25 positions shown · non-contrast
Comparison: None.

CLINICAL DATA: Gastroesophageal reflux, esophagitis

EXAM:
ULTRASOUND ABDOMEN LIMITED RIGHT UPPER QUADRANT

[Series 1: us abdomen limited · 0.15mm/px · 14 of 44 slices shown]
[im 1/44]
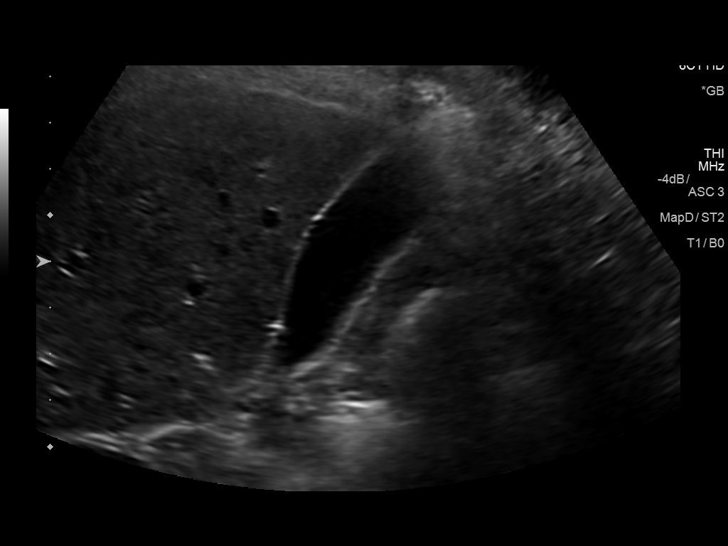
[im 4/44]
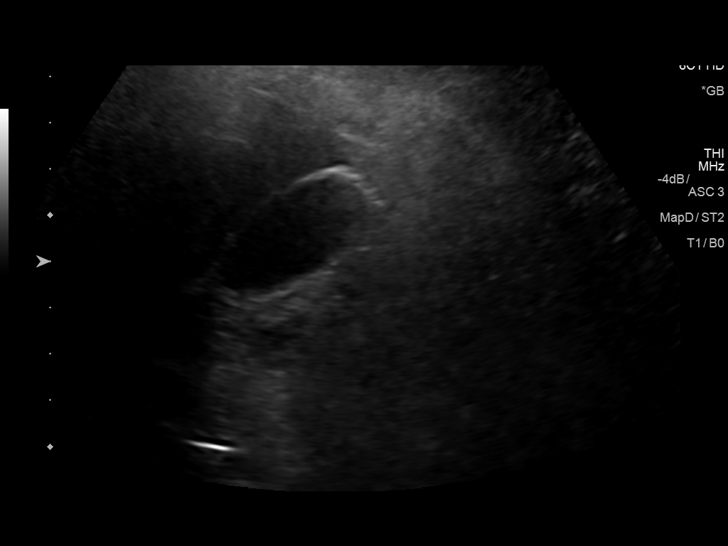
[im 8/44]
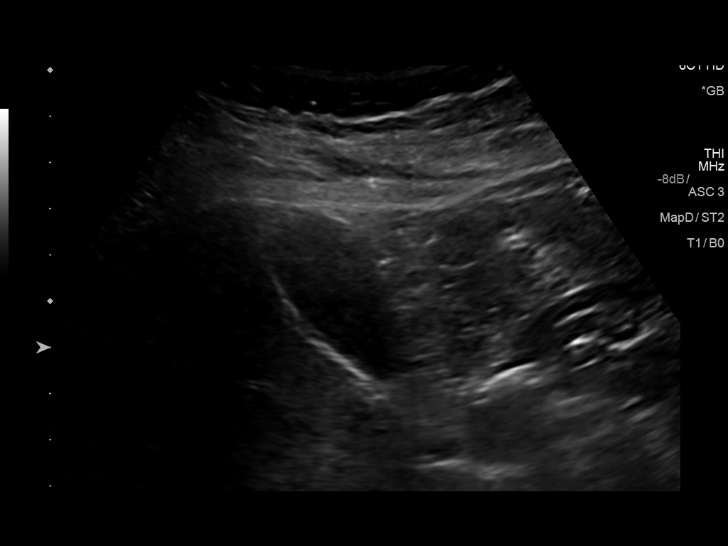
[im 11/44]
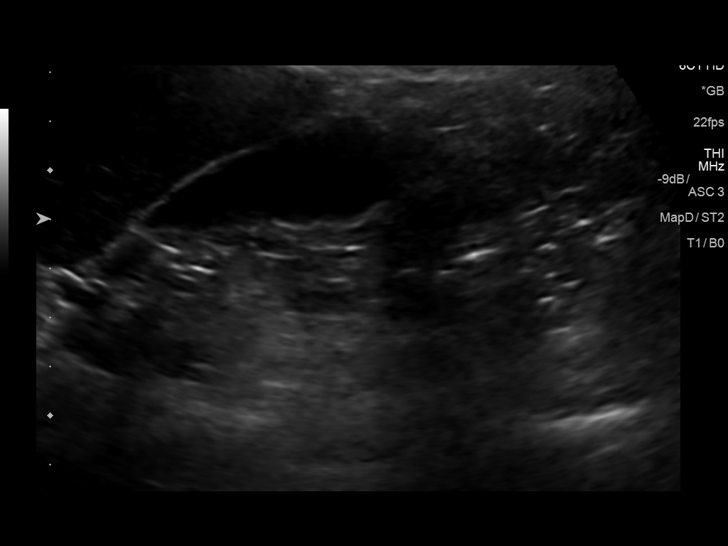
[im 15/44]
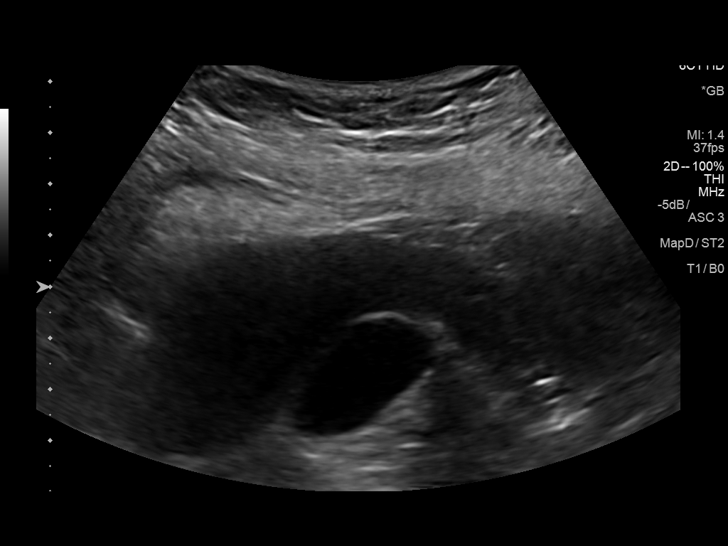
[im 17/44]
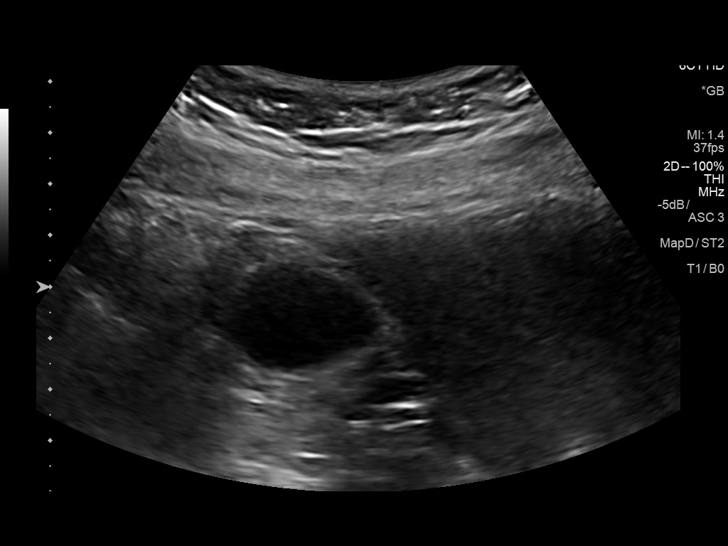
[im 20/44]
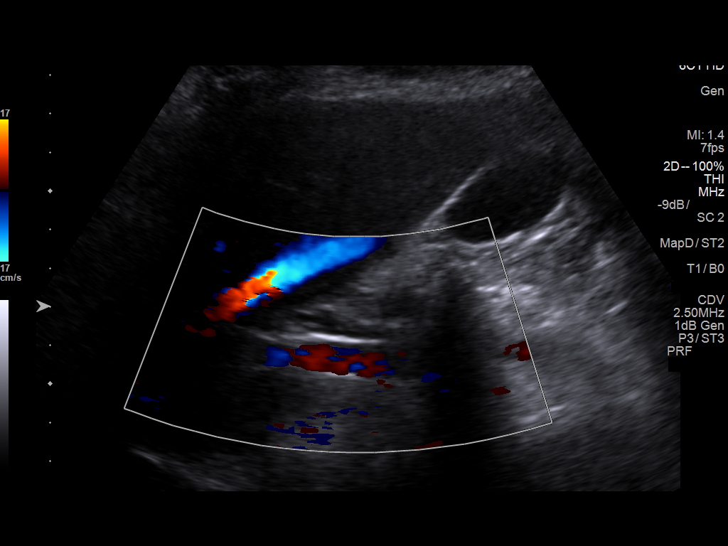
[im 24/44]
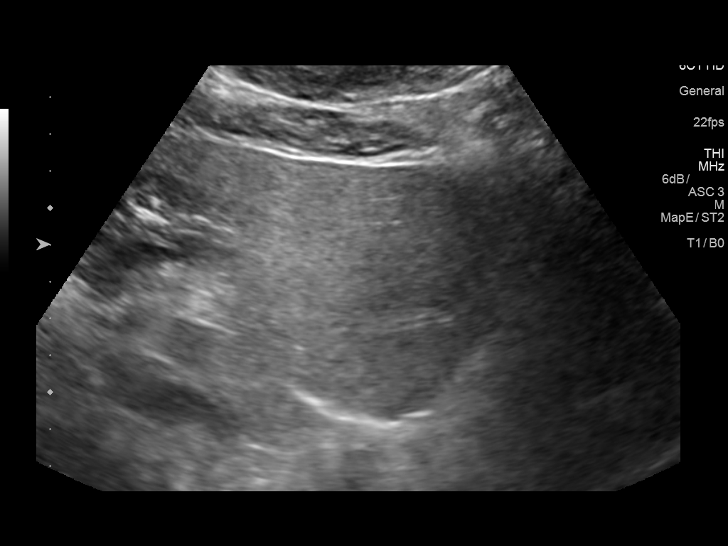
[im 27/44]
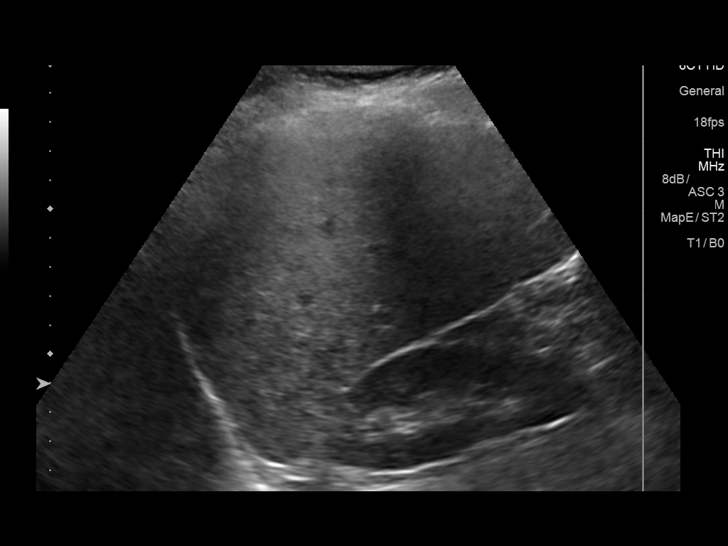
[im 29/44]
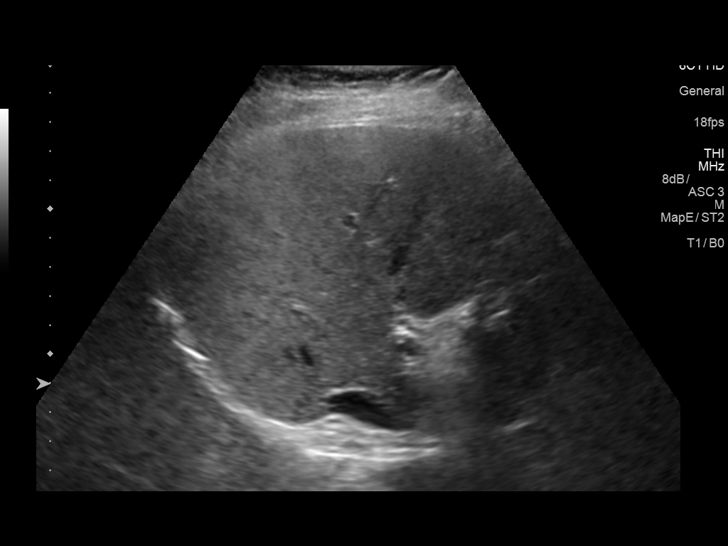
[im 33/44]
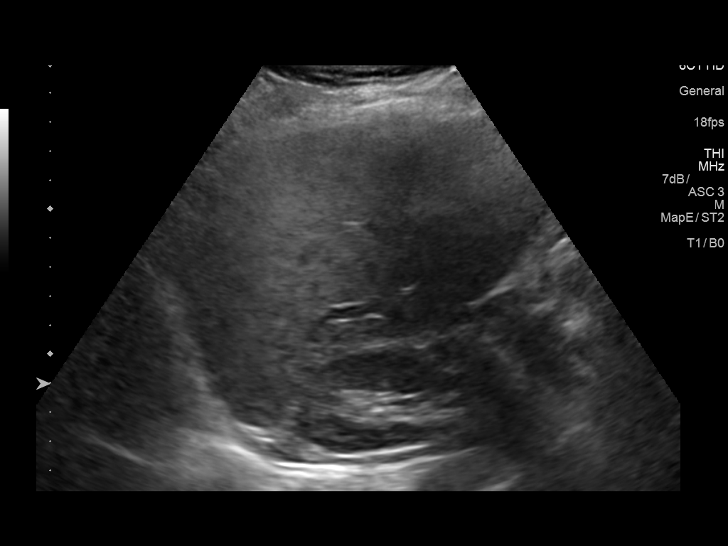
[im 36/44]
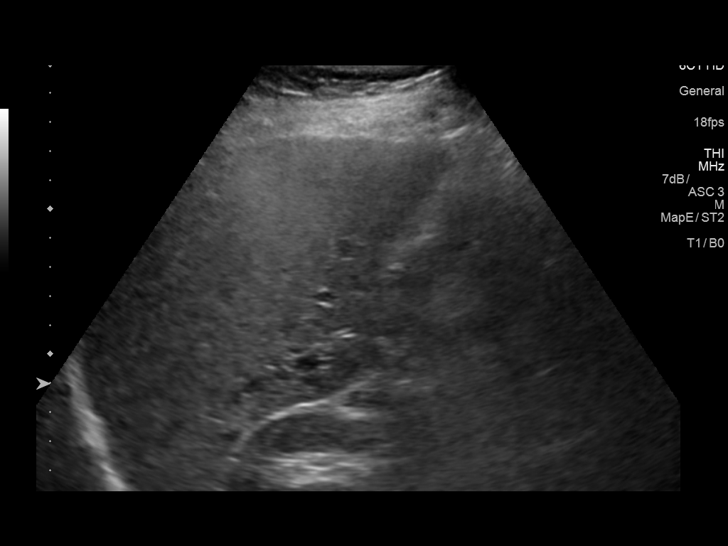
[im 40/44]
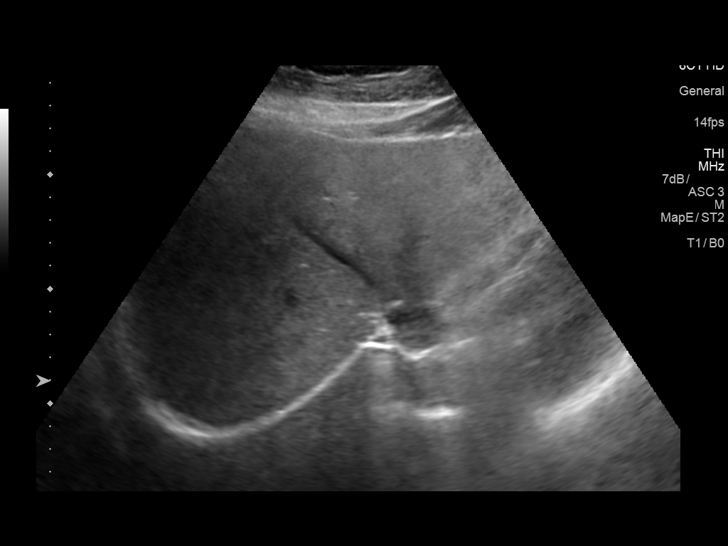
[im 44/44]
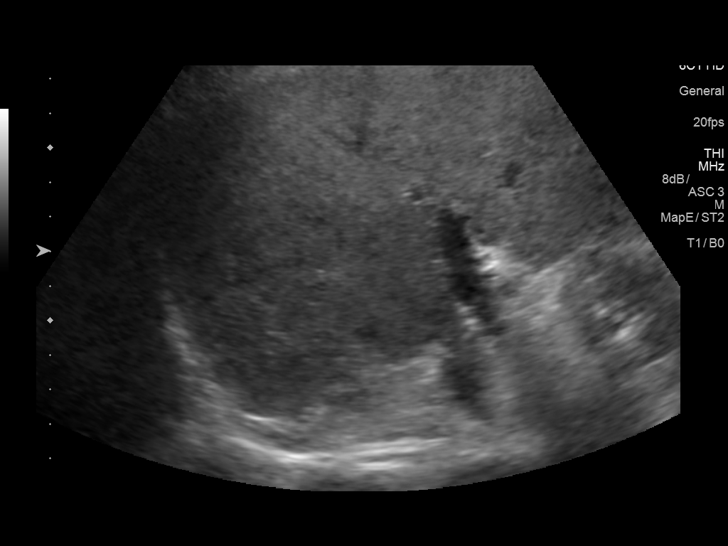

[14 of 25 positions shown; findings below may reference images not displayed]

FINDINGS: Gallbladder:

No gallstones or wall thickening visualized. No sonographic Murphy
sign noted by sonographer.

Common bile duct:

Diameter: 5.3 mm

Liver:

No focal lesion identified. Increased hepatic parenchymal
echogenicity. Portal vein is patent on color Doppler imaging with
normal direction of blood flow towards the liver.
IMPRESSION: 1. No cholelithiasis or sonographic evidence of acute cholecystitis.
2. Increased hepatic echogenicity as can be seen with hepatic
steatosis.

## 2019-06-20 DIAGNOSIS — Z13220 Encounter for screening for lipoid disorders: Secondary | ICD-10-CM | POA: Diagnosis not present

## 2019-06-20 DIAGNOSIS — R7309 Other abnormal glucose: Secondary | ICD-10-CM | POA: Diagnosis not present

## 2019-06-20 DIAGNOSIS — Z0000 Encounter for general adult medical examination without abnormal findings: Secondary | ICD-10-CM | POA: Diagnosis not present

## 2019-06-27 DIAGNOSIS — D2271 Melanocytic nevi of right lower limb, including hip: Secondary | ICD-10-CM | POA: Diagnosis not present

## 2019-06-27 DIAGNOSIS — D485 Neoplasm of uncertain behavior of skin: Secondary | ICD-10-CM | POA: Diagnosis not present

## 2019-06-27 DIAGNOSIS — L814 Other melanin hyperpigmentation: Secondary | ICD-10-CM | POA: Diagnosis not present

## 2019-06-27 DIAGNOSIS — L438 Other lichen planus: Secondary | ICD-10-CM | POA: Diagnosis not present

## 2019-06-27 DIAGNOSIS — L570 Actinic keratosis: Secondary | ICD-10-CM | POA: Diagnosis not present

## 2019-06-27 DIAGNOSIS — L821 Other seborrheic keratosis: Secondary | ICD-10-CM | POA: Diagnosis not present

## 2019-06-27 DIAGNOSIS — D1801 Hemangioma of skin and subcutaneous tissue: Secondary | ICD-10-CM | POA: Diagnosis not present

## 2019-07-15 DIAGNOSIS — Z20822 Contact with and (suspected) exposure to covid-19: Secondary | ICD-10-CM | POA: Diagnosis not present

## 2019-07-16 DIAGNOSIS — Z20828 Contact with and (suspected) exposure to other viral communicable diseases: Secondary | ICD-10-CM | POA: Diagnosis not present

## 2019-07-16 DIAGNOSIS — Z03818 Encounter for observation for suspected exposure to other biological agents ruled out: Secondary | ICD-10-CM | POA: Diagnosis not present

## 2020-01-03 DIAGNOSIS — Z1211 Encounter for screening for malignant neoplasm of colon: Secondary | ICD-10-CM | POA: Diagnosis not present

## 2020-01-03 DIAGNOSIS — K219 Gastro-esophageal reflux disease without esophagitis: Secondary | ICD-10-CM | POA: Diagnosis not present

## 2020-01-16 DIAGNOSIS — N3090 Cystitis, unspecified without hematuria: Secondary | ICD-10-CM | POA: Diagnosis not present

## 2020-01-16 DIAGNOSIS — R350 Frequency of micturition: Secondary | ICD-10-CM | POA: Diagnosis not present

## 2020-01-16 DIAGNOSIS — S8012XA Contusion of left lower leg, initial encounter: Secondary | ICD-10-CM | POA: Diagnosis not present

## 2020-02-20 DIAGNOSIS — H1032 Unspecified acute conjunctivitis, left eye: Secondary | ICD-10-CM | POA: Diagnosis not present

## 2020-04-22 DIAGNOSIS — U071 COVID-19: Secondary | ICD-10-CM | POA: Diagnosis not present

## 2020-04-22 DIAGNOSIS — Z20822 Contact with and (suspected) exposure to covid-19: Secondary | ICD-10-CM | POA: Diagnosis not present

## 2020-06-11 DIAGNOSIS — Z1211 Encounter for screening for malignant neoplasm of colon: Secondary | ICD-10-CM | POA: Diagnosis not present

## 2020-06-11 DIAGNOSIS — K635 Polyp of colon: Secondary | ICD-10-CM | POA: Diagnosis not present

## 2020-06-11 DIAGNOSIS — K5730 Diverticulosis of large intestine without perforation or abscess without bleeding: Secondary | ICD-10-CM | POA: Diagnosis not present

## 2020-06-11 DIAGNOSIS — D124 Benign neoplasm of descending colon: Secondary | ICD-10-CM | POA: Diagnosis not present

## 2020-06-26 DIAGNOSIS — Z6830 Body mass index (BMI) 30.0-30.9, adult: Secondary | ICD-10-CM | POA: Diagnosis not present

## 2020-06-26 DIAGNOSIS — Z1231 Encounter for screening mammogram for malignant neoplasm of breast: Secondary | ICD-10-CM | POA: Diagnosis not present

## 2020-06-26 DIAGNOSIS — Z01419 Encounter for gynecological examination (general) (routine) without abnormal findings: Secondary | ICD-10-CM | POA: Diagnosis not present

## 2020-12-05 DIAGNOSIS — L821 Other seborrheic keratosis: Secondary | ICD-10-CM | POA: Diagnosis not present

## 2020-12-05 DIAGNOSIS — D225 Melanocytic nevi of trunk: Secondary | ICD-10-CM | POA: Diagnosis not present

## 2021-06-24 DIAGNOSIS — F419 Anxiety disorder, unspecified: Secondary | ICD-10-CM | POA: Diagnosis not present

## 2021-06-24 DIAGNOSIS — R7309 Other abnormal glucose: Secondary | ICD-10-CM | POA: Diagnosis not present

## 2021-06-24 DIAGNOSIS — Z13220 Encounter for screening for lipoid disorders: Secondary | ICD-10-CM | POA: Diagnosis not present

## 2021-06-24 DIAGNOSIS — E669 Obesity, unspecified: Secondary | ICD-10-CM | POA: Diagnosis not present

## 2021-06-24 DIAGNOSIS — Z1159 Encounter for screening for other viral diseases: Secondary | ICD-10-CM | POA: Diagnosis not present

## 2021-06-24 DIAGNOSIS — Z0000 Encounter for general adult medical examination without abnormal findings: Secondary | ICD-10-CM | POA: Diagnosis not present

## 2021-06-25 ENCOUNTER — Other Ambulatory Visit: Payer: Self-pay | Admitting: Family Medicine

## 2021-06-25 DIAGNOSIS — E2839 Other primary ovarian failure: Secondary | ICD-10-CM

## 2021-07-29 DIAGNOSIS — Z01419 Encounter for gynecological examination (general) (routine) without abnormal findings: Secondary | ICD-10-CM | POA: Diagnosis not present

## 2021-07-29 DIAGNOSIS — N393 Stress incontinence (female) (male): Secondary | ICD-10-CM | POA: Insufficient documentation

## 2021-07-29 DIAGNOSIS — Z1231 Encounter for screening mammogram for malignant neoplasm of breast: Secondary | ICD-10-CM | POA: Diagnosis not present

## 2021-08-27 DIAGNOSIS — N3946 Mixed incontinence: Secondary | ICD-10-CM | POA: Diagnosis not present

## 2021-08-27 DIAGNOSIS — R350 Frequency of micturition: Secondary | ICD-10-CM | POA: Diagnosis not present

## 2021-08-27 DIAGNOSIS — M6281 Muscle weakness (generalized): Secondary | ICD-10-CM | POA: Diagnosis not present

## 2021-12-08 DIAGNOSIS — T63461A Toxic effect of venom of wasps, accidental (unintentional), initial encounter: Secondary | ICD-10-CM | POA: Diagnosis not present

## 2021-12-08 DIAGNOSIS — M89319 Hypertrophy of bone, unspecified shoulder: Secondary | ICD-10-CM | POA: Diagnosis not present

## 2021-12-09 ENCOUNTER — Other Ambulatory Visit: Payer: Self-pay | Admitting: Family Medicine

## 2021-12-09 DIAGNOSIS — M89319 Hypertrophy of bone, unspecified shoulder: Secondary | ICD-10-CM

## 2021-12-11 ENCOUNTER — Ambulatory Visit
Admission: RE | Admit: 2021-12-11 | Discharge: 2021-12-11 | Disposition: A | Discharge status: Home / Self Care | Payer: Medicare Other | Source: Ambulatory Visit | Attending: Family Medicine | Admitting: Family Medicine

## 2021-12-11 DIAGNOSIS — M89319 Hypertrophy of bone, unspecified shoulder: Secondary | ICD-10-CM

## 2021-12-11 DIAGNOSIS — R2232 Localized swelling, mass and lump, left upper limb: Secondary | ICD-10-CM | POA: Diagnosis not present

## 2021-12-12 ENCOUNTER — Other Ambulatory Visit: Payer: Medicare Other

## 2022-02-04 ENCOUNTER — Other Ambulatory Visit: Payer: Self-pay | Admitting: Family Medicine

## 2022-02-04 ENCOUNTER — Other Ambulatory Visit: Payer: 59

## 2022-04-27 DIAGNOSIS — H5203 Hypermetropia, bilateral: Secondary | ICD-10-CM | POA: Diagnosis not present

## 2022-07-01 DIAGNOSIS — M1990 Unspecified osteoarthritis, unspecified site: Secondary | ICD-10-CM | POA: Diagnosis not present

## 2022-07-01 DIAGNOSIS — R7309 Other abnormal glucose: Secondary | ICD-10-CM | POA: Diagnosis not present

## 2022-07-01 DIAGNOSIS — Z9181 History of falling: Secondary | ICD-10-CM | POA: Diagnosis not present

## 2022-07-01 DIAGNOSIS — Z0000 Encounter for general adult medical examination without abnormal findings: Secondary | ICD-10-CM | POA: Diagnosis not present

## 2022-07-01 DIAGNOSIS — E669 Obesity, unspecified: Secondary | ICD-10-CM | POA: Diagnosis not present

## 2022-07-01 DIAGNOSIS — F419 Anxiety disorder, unspecified: Secondary | ICD-10-CM | POA: Diagnosis not present

## 2022-07-02 ENCOUNTER — Other Ambulatory Visit: Payer: Self-pay | Admitting: Family Medicine

## 2022-07-02 DIAGNOSIS — E2839 Other primary ovarian failure: Secondary | ICD-10-CM

## 2022-07-28 DIAGNOSIS — K080 Exfoliation of teeth due to systemic causes: Secondary | ICD-10-CM | POA: Diagnosis not present

## 2022-07-30 DIAGNOSIS — R030 Elevated blood-pressure reading, without diagnosis of hypertension: Secondary | ICD-10-CM | POA: Diagnosis not present

## 2022-07-30 DIAGNOSIS — G4719 Other hypersomnia: Secondary | ICD-10-CM | POA: Diagnosis not present

## 2022-08-05 DIAGNOSIS — Z1231 Encounter for screening mammogram for malignant neoplasm of breast: Secondary | ICD-10-CM | POA: Diagnosis not present

## 2022-08-11 DIAGNOSIS — D225 Melanocytic nevi of trunk: Secondary | ICD-10-CM | POA: Diagnosis not present

## 2022-08-11 DIAGNOSIS — L821 Other seborrheic keratosis: Secondary | ICD-10-CM | POA: Diagnosis not present

## 2022-08-11 DIAGNOSIS — I788 Other diseases of capillaries: Secondary | ICD-10-CM | POA: Diagnosis not present

## 2022-08-11 DIAGNOSIS — L814 Other melanin hyperpigmentation: Secondary | ICD-10-CM | POA: Diagnosis not present

## 2022-09-08 DIAGNOSIS — G4733 Obstructive sleep apnea (adult) (pediatric): Secondary | ICD-10-CM | POA: Diagnosis not present

## 2022-09-08 DIAGNOSIS — E669 Obesity, unspecified: Secondary | ICD-10-CM | POA: Diagnosis not present

## 2022-10-07 DIAGNOSIS — G4733 Obstructive sleep apnea (adult) (pediatric): Secondary | ICD-10-CM | POA: Diagnosis not present

## 2022-11-07 DIAGNOSIS — G4733 Obstructive sleep apnea (adult) (pediatric): Secondary | ICD-10-CM | POA: Diagnosis not present

## 2022-12-07 DIAGNOSIS — G4733 Obstructive sleep apnea (adult) (pediatric): Secondary | ICD-10-CM | POA: Diagnosis not present

## 2022-12-11 ENCOUNTER — Ambulatory Visit
Admission: RE | Admit: 2022-12-11 | Discharge: 2022-12-11 | Disposition: A | Discharge status: Home / Self Care | Payer: Medicare Other | Source: Ambulatory Visit | Attending: Family Medicine | Admitting: Family Medicine

## 2022-12-11 DIAGNOSIS — E2839 Other primary ovarian failure: Secondary | ICD-10-CM

## 2022-12-11 DIAGNOSIS — N958 Other specified menopausal and perimenopausal disorders: Secondary | ICD-10-CM | POA: Diagnosis not present

## 2022-12-15 DIAGNOSIS — E669 Obesity, unspecified: Secondary | ICD-10-CM | POA: Diagnosis not present

## 2022-12-15 DIAGNOSIS — G4733 Obstructive sleep apnea (adult) (pediatric): Secondary | ICD-10-CM | POA: Diagnosis not present

## 2022-12-23 DIAGNOSIS — G4733 Obstructive sleep apnea (adult) (pediatric): Secondary | ICD-10-CM | POA: Diagnosis not present

## 2023-01-07 DIAGNOSIS — G4733 Obstructive sleep apnea (adult) (pediatric): Secondary | ICD-10-CM | POA: Diagnosis not present

## 2023-01-23 DIAGNOSIS — G4733 Obstructive sleep apnea (adult) (pediatric): Secondary | ICD-10-CM | POA: Diagnosis not present

## 2023-04-29 DIAGNOSIS — G4733 Obstructive sleep apnea (adult) (pediatric): Secondary | ICD-10-CM | POA: Diagnosis not present

## 2023-06-03 DIAGNOSIS — K080 Exfoliation of teeth due to systemic causes: Secondary | ICD-10-CM | POA: Diagnosis not present

## 2023-06-10 DIAGNOSIS — K080 Exfoliation of teeth due to systemic causes: Secondary | ICD-10-CM | POA: Diagnosis not present

## 2023-06-22 DIAGNOSIS — G4733 Obstructive sleep apnea (adult) (pediatric): Secondary | ICD-10-CM | POA: Diagnosis not present

## 2023-07-05 DIAGNOSIS — E669 Obesity, unspecified: Secondary | ICD-10-CM | POA: Diagnosis not present

## 2023-07-05 DIAGNOSIS — G4733 Obstructive sleep apnea (adult) (pediatric): Secondary | ICD-10-CM | POA: Diagnosis not present

## 2023-07-05 DIAGNOSIS — M1990 Unspecified osteoarthritis, unspecified site: Secondary | ICD-10-CM | POA: Diagnosis not present

## 2023-07-05 DIAGNOSIS — Z0000 Encounter for general adult medical examination without abnormal findings: Secondary | ICD-10-CM | POA: Diagnosis not present

## 2023-07-05 DIAGNOSIS — M79643 Pain in unspecified hand: Secondary | ICD-10-CM | POA: Diagnosis not present

## 2023-07-05 DIAGNOSIS — R7309 Other abnormal glucose: Secondary | ICD-10-CM | POA: Diagnosis not present

## 2023-07-05 DIAGNOSIS — F419 Anxiety disorder, unspecified: Secondary | ICD-10-CM | POA: Diagnosis not present

## 2023-07-28 DIAGNOSIS — G4733 Obstructive sleep apnea (adult) (pediatric): Secondary | ICD-10-CM | POA: Diagnosis not present

## 2023-08-16 DIAGNOSIS — H524 Presbyopia: Secondary | ICD-10-CM | POA: Diagnosis not present

## 2023-09-23 DIAGNOSIS — Z1272 Encounter for screening for malignant neoplasm of vagina: Secondary | ICD-10-CM | POA: Diagnosis not present

## 2023-09-23 DIAGNOSIS — Z01419 Encounter for gynecological examination (general) (routine) without abnormal findings: Secondary | ICD-10-CM | POA: Diagnosis not present

## 2023-09-23 DIAGNOSIS — Z1231 Encounter for screening mammogram for malignant neoplasm of breast: Secondary | ICD-10-CM | POA: Diagnosis not present

## 2023-09-23 DIAGNOSIS — R300 Dysuria: Secondary | ICD-10-CM | POA: Diagnosis not present

## 2023-09-23 DIAGNOSIS — R350 Frequency of micturition: Secondary | ICD-10-CM | POA: Diagnosis not present

## 2023-11-04 DIAGNOSIS — G4733 Obstructive sleep apnea (adult) (pediatric): Secondary | ICD-10-CM | POA: Diagnosis not present

## 2023-11-29 ENCOUNTER — Ambulatory Visit (INDEPENDENT_AMBULATORY_CARE_PROVIDER_SITE_OTHER)

## 2023-11-29 ENCOUNTER — Ambulatory Visit

## 2023-11-29 ENCOUNTER — Ambulatory Visit: Payer: Medicare Other | Attending: Internal Medicine | Admitting: Internal Medicine

## 2023-11-29 ENCOUNTER — Encounter: Payer: Self-pay | Admitting: Internal Medicine

## 2023-11-29 VITALS — BP 116/74 | HR 67 | Resp 14 | Ht 64.75 in | Wt 182.0 lb

## 2023-11-29 DIAGNOSIS — M79641 Pain in right hand: Secondary | ICD-10-CM | POA: Insufficient documentation

## 2023-11-29 DIAGNOSIS — M25562 Pain in left knee: Secondary | ICD-10-CM

## 2023-11-29 DIAGNOSIS — M79642 Pain in left hand: Secondary | ICD-10-CM | POA: Diagnosis not present

## 2023-11-29 DIAGNOSIS — Z79899 Other long term (current) drug therapy: Secondary | ICD-10-CM | POA: Diagnosis not present

## 2023-11-29 DIAGNOSIS — G8929 Other chronic pain: Secondary | ICD-10-CM | POA: Diagnosis not present

## 2023-11-29 DIAGNOSIS — K5730 Diverticulosis of large intestine without perforation or abscess without bleeding: Secondary | ICD-10-CM | POA: Insufficient documentation

## 2023-11-29 DIAGNOSIS — M25561 Pain in right knee: Secondary | ICD-10-CM | POA: Diagnosis not present

## 2023-11-29 DIAGNOSIS — Z1211 Encounter for screening for malignant neoplasm of colon: Secondary | ICD-10-CM | POA: Insufficient documentation

## 2023-11-29 DIAGNOSIS — K219 Gastro-esophageal reflux disease without esophagitis: Secondary | ICD-10-CM | POA: Insufficient documentation

## 2023-11-29 NOTE — Progress Notes (Signed)
 Office Visit Note  Patient: Lauren Byrd             Date of Birth: 11/19/1955           MRN: [id number]             PCP: Leavy Mole, PA-C Referring: Loreli Kins, MD Visit Date: 11/29/2023 Occupation: Retired, Manufacturing systems engineer  Subjective:  New Patient (Initial Visit) (Patient states she has pain and stiffness in her hands and knees. )   Discussed the use of AI scribe software for clinical note transcription with the patient, who gave verbal consent to proceed.  History of Present Illness   Lauren Byrd is a 68 year old female who presents for evaluation of joint pain and stiffness and swelling affecting her hands and knees.  Her hands have been particularly affected over the past year, with a loss of strength. She has recently noticed that her fingers are bent out to the side and is not sure since how long ago this started. The stiffness occurs intermittently, especially when using her phone or playing games. No swelling or redness in her hands is noted.  She has noticed some worsening of her grip strength particularly in the left hand.  No numbness in fingers.  Her knee stiffness has been ongoing for several years, and she reports frequent swelling. She has a history of left knee ACL injury and subsequent replacement surgery on one knee.  She often notices swelling in 1 or both knees particularly after increased use during the preceding day.  She does not see an orthopedist for her knee symptoms within the past few years.  She is not aware of any recent joint imaging.  For symptom management, she uses [facility] and takes ibuprofen, approximately two tablets per day, which she finds somewhat helpful. She denies any adverse effects from the medication.  In her family history, her mother also had arthritis.   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  She has recently taken up walking with a friend as a form of exercise.      Labs  reviewed 06/2023 ANA neg RF neg  06/2021 HCV neg  Activities of Daily Living:  Patient reports morning stiffness for 2 hours.   Patient Reports nocturnal pain.  Difficulty dressing/grooming: Denies Difficulty climbing stairs: Reports Difficulty getting out of chair: Denies Difficulty using hands for taps, buttons, cutlery, and/or writing: Reports  Review of Systems  Constitutional:  Negative for fatigue.  HENT:  Negative for mouth sores and mouth dryness.   Eyes:  Negative for dryness.  Respiratory:  Negative for shortness of breath.   Cardiovascular:  Negative for chest pain and palpitations.  Gastrointestinal:  Negative for blood in stool, constipation and diarrhea.  Endocrine: Negative for increased urination.  Genitourinary:  Positive for involuntary urination.  Musculoskeletal:  Positive for joint pain, joint pain, joint swelling and morning stiffness. Negative for gait problem, myalgias, muscle weakness, muscle tenderness and myalgias.  Skin:  Negative for color change, rash, hair loss and sensitivity to sunlight.  Allergic/Immunologic: Negative for susceptible to infections.  Neurological:  Negative for dizziness and headaches.  Hematological:  Negative for swollen glands.  Psychiatric/Behavioral:  Positive for sleep disturbance. Negative for depressed mood. The patient is not nervous/anxious.     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   Colon cancer screening 11/29/2023   Acid reflux 11/29/2023   Female stress incontinence 07/29/2021   Anxiety    Arthritis     Past Medical History:  Diagnosis Date   Anxiety    Arthritis    GERD (gastroesophageal reflux disease)     Family History  Problem Relation Age of Onset   Cholecystitis Mother    Cholecystitis Sister    Past Surgical History:  Procedure Laterality Date   ACL  replacement     jaw joint replacement     VAGINAL HYSTERECTOMY     Social History   Social History Narrative   Not on file   Immunization History  Administered Date(s) Administered   Influenza Inj Mdck Quad With Preservative 03/17/2018   Influenza,inj,Quad PF,6+ Mos 06/24/2016   Influenza-Unspecified 02/08/2017, 02/08/2018   PNEUMOCOCCAL CONJUGATE-20 06/24/2021     Objective: Vital Signs: BP 116/74 (BP Location: Left Arm, Patient Position: Sitting, Cuff Size: Large)   Pulse 67   Resp 14   Ht 5' 4.75 (1.645 m)   Wt 182 lb (82.6 kg)   BMI 30.52 kg/m    Physical Exam HENT:     Mouth/Throat:     Mouth: Mucous membranes are moist.     Pharynx: Oropharynx is clear.  Eyes:     Conjunctiva/sclera: Conjunctivae normal.  Cardiovascular:     Rate and Rhythm: Normal rate and regular rhythm.  Pulmonary:     Effort: Pulmonary effort is normal.     Breath sounds: Normal breath sounds.  Musculoskeletal:     Right lower leg: No edema.     Left lower leg: No edema.  Lymphadenopathy:     Cervical: No cervical adenopathy.  Skin:    General: Skin is warm and dry.     Findings: No rash.  Neurological:     Mental Status: She is alert.  Psychiatric:        Mood and Affect: Mood normal.      Musculoskeletal Exam:  Shoulders full ROM no tenderness or swelling Elbows full ROM no tenderness or swelling Wrists full ROM, left wrist pain with full flexion no palpable effusion Some thenar muscle loss on left hand Fingers with MCP joint widening, some nontender swelling present at 2nd MCP, reducible lateral deviation worse on right hand Knees full ROM, anterior joint line tenderness to pressure, right knee with palpable effusion in suprapatellar pouch Ankles full ROM no tenderness or swelling MTPs full ROM no tenderness or swelling   Investigation: No additional findings.  Imaging: XR KNEE 3 VIEW LEFT Result Date: 11/29/2023 X-ray left knee 3 views There is severe loss of joint  space and extensive bone spurring most advanced in the medial compartment.  Osteophytes prominent in the posterior as well as anterior distribution.  There may be some calcification in soft tissue structures and posterior joint as well.  Patellofemoral compartment with appears preserved but with marginal bone spurring. 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  Lateral compartment appears preserved.  Some inferior and lateral patellar osteophyte.  Patellofemoral compartment space appears preserved.  No erosions or abnormal calcifications seen. Impression Moderately severe appearing medial compartment and mild patellofemoral compartment osteoarthritis  XR Hand 2 View Right Result Date: 11/29/2023 X-ray right hand 2 views Radiocarpal joint space appears normal.  Mild degenerative change  at first [facility] joint with bone spurring of trapezium.  MCP joint bone spurring on lateral sides and slight narrowing at the third MCP.  DIP joints are well-preserved.  No erosions or abnormal calcifications seen.  Bone mineralization appears normal. Impression Osteoarthritic changes in her proximal distribution of first CMC and MCP joints, no erosive disease  XR Hand 2 View Left Result Date: 11/29/2023 X-ray left hand 2 views Radiocarpal joint space appears normal.  Mild degenerative changes of first [facility] joint with bone spurring of the trapezium.  Mild MCP joint changes with lateral bone spurring but no significant loss of joint space.  Mild fifth DIP joint bone spurring.  No erosions or abnormal calcifications seen.  Bone mineralization appears normal. Impression Osteoarthritic changes more advanced in proximal distribution first CMC and MCPs, no erosive changes   Recent Labs: Lab Results  Component Value Date   WBC 18.1 (H)  01/14/2016   HGB 13.8 01/14/2016   PLT 323 01/14/2016   NA 139 01/14/2016   K 3.9 01/14/2016   CL 106 01/14/2016   CO2 25 01/14/2016   GLUCOSE 147 (H) 01/14/2016   BUN 23 (H) 01/14/2016   CREATININE 0.81 01/14/2016   BILITOT 0.8 01/14/2016   ALKPHOS 60 01/14/2016   AST 21 01/14/2016   ALT 20 01/14/2016   PROT 7.1 01/14/2016   ALBUMIN 4.1 01/14/2016   CALCIUM 8.8 (L) 01/14/2016   GFRAA >60 01/14/2016    Speciality Comments: No specialty comments available.  Procedures:  No procedures performed Allergies: Amoxicillin -pot clavulanate, Oxycodone, and Sulfonamide derivatives   Assessment / Plan:     Visit Diagnoses: Bilateral hand pain - Plan: XR Hand 2 View Right, XR Hand 2 View Left, Sedimentation rate, C-reactive protein, Cyclic citrul peptide antibody, IgG Significant stiffness, weakness, and finger deviation with structural changes and muscle atrophy suggest rheumatoid arthritis.  She does have prolonged morning stiffness pretty good responsive to NSAIDs.  X-ray of the hands today shows only mild degenerative changes and worse in the proximal distribution and there is no corresponding occupational history for this.  Very suspicious so if labs today are negative so recommend follow-up including ultrasound examination for effusions or hyperemia. - Ordered x-rays of hands today - Checking CCP antibody, sed rate, and CRP - Continue ibuprofen 400 mg as needed up to every 8 hours - Provide a handout on OA supplements  High risk medication use - Plan: CBC with Differential/Platelet, Comprehensive metabolic panel with GFR Checking CBC and CMP for baseline assessment concerning possible DMARD or with long-term use of NSAIDs.  Previous negative hepatitis C screening reviewed from 2023.    Osteoarthritis of knees Chronic stiffness and swelling with effusion and crepitus indicate osteoarthritis. Symptoms improve with movement.  X-ray of the knees today shows medial compartment  osteoarthritis moderate in the right knee left knee is very severe including more tricompartmental disease as well.  Corresponds with her history of previous collateral ligament tear with repair. - Order x-rays of knees today - Continuing symptom treatments as above   Orders: Orders Placed This Encounter  Procedures   XR Hand 2 View Right   XR Hand 2 View Left   XR KNEE 3 VIEW RIGHT   XR KNEE 3 VIEW LEFT   Sedimentation rate   C-reactive protein   Cyclic citrul peptide antibody, IgG   CBC with Differential/Platelet   Comprehensive metabolic panel with GFR   No orders of the defined types were placed in this encounter.   Follow-Up Instructions: Return  in about 4 weeks (around 12/27/2023) for New pt ?RA f/u 46mo.   Lonni LELON Ester, MD  Note - This record has been created using [employer].  Chart creation errors have been sought, but may not always  have been located. Such creation errors do not reflect on  the standard of medical care.

## 2023-11-30 LAB — COMPREHENSIVE METABOLIC PANEL WITH GFR
AG Ratio: 1.6 (calc) (ref 1.0–2.5)
ALT: 21 U/L (ref 6–29)
AST: 15 U/L (ref 10–35)
Albumin: 4.3 g/dL (ref 3.6–5.1)
Alkaline phosphatase (APISO): 63 U/L (ref 37–153)
BUN: 18 mg/dL (ref 7–25)
CO2: 27 mmol/L (ref 20–32)
Calcium: 9.7 mg/dL (ref 8.6–10.4)
Chloride: 104 mmol/L (ref 98–110)
Creat: 0.83 mg/dL (ref 0.50–1.05)
Globulin: 2.7 g/dL (ref 1.9–3.7)
Glucose, Bld: 93 mg/dL (ref 65–99)
Potassium: 5 mmol/L (ref 3.5–5.3)
Sodium: 140 mmol/L (ref 135–146)
Total Bilirubin: 0.5 mg/dL (ref 0.2–1.2)
Total Protein: 7 g/dL (ref 6.1–8.1)
eGFR: 77 mL/min/1.73m2 (ref >=60)

## 2023-11-30 LAB — CBC WITH DIFFERENTIAL/PLATELET
Absolute Lymphocytes: 1643 {cells}/uL (ref 850–3900)
Absolute Monocytes: 390 {cells}/uL (ref 200–950)
Basophils Absolute: 30 {cells}/uL (ref 0–200)
Basophils Relative: 0.4 %
Eosinophils Absolute: 60 {cells}/uL (ref 15–500)
Eosinophils Relative: 0.8 %
HCT: 41.8 % (ref 35.0–45.0)
Hemoglobin: 13.3 g/dL (ref 11.7–15.5)
MCH: 30.2 pg (ref 27.0–33.0)
MCHC: 31.8 g/dL — ABNORMAL LOW (ref 32.0–36.0)
MCV: 95 fL (ref 80.0–100.0)
MPV: 10.3 fL (ref 7.5–12.5)
Monocytes Relative: 5.2 %
Neutro Abs: 5378 {cells}/uL (ref 1500–7800)
Neutrophils Relative %: 71.7 %
Platelets: 339 Thousand/uL (ref 140–400)
RBC: 4.4 Million/uL (ref 3.80–5.10)
RDW: 12.6 % (ref 11.0–15.0)
Total Lymphocyte: 21.9 %
WBC: 7.5 Thousand/uL (ref 3.8–10.8)

## 2023-11-30 LAB — CYCLIC CITRUL PEPTIDE ANTIBODY, IGG: Cyclic Citrullin Peptide Ab: <16 U

## 2023-11-30 LAB — SEDIMENTATION RATE: Sed Rate: 6 mm/h (ref 0–30)

## 2023-11-30 LAB — C-REACTIVE PROTEIN: CRP: 5.8 mg/L (ref <8.0)

## 2023-12-28 DIAGNOSIS — K080 Exfoliation of teeth due to systemic causes: Secondary | ICD-10-CM | POA: Diagnosis not present

## 2024-01-02 NOTE — Progress Notes (Signed)
 Office Visit Note  Patient: Lauren Byrd             Date of Birth: 07-28-1955           MRN: [id number]             PCP: Leavy Mole, PA-C Referring: Leavy Mole, PA-C Visit Date: 01/03/2024   Subjective:  Follow-up (Bilateral hand/thumb pain an knee pain)   Discussed the use of AI scribe software for clinical note transcription with the patient, who gave verbal consent to proceed.  History of Present Illness   Lauren Byrd is a 68 year old female with osteoarthritis who presents with ongoing joint pain in her hands and knees.  She experiences significant pain in both hands, particularly during use, described as a persistent ache that affects her daily activities. Pressing on the area exacerbates the pain. Lab tests evaluation at our initial visit, including antibody markers for rheumatoid arthritis and inflammation markers like sedimentation rate and C-reactive protein, are normal.   She experiences knee pain and has a history of ACL replacement in her knee.  She currently takes ibuprofen for pain management but reports it does not significantly alleviate her symptoms. She has also tried ginger root as a supplement. She has not used any prescription anti-inflammatory medications recently.  She has experienced acid reflux in the past, which can be exacerbated by frequent use of ibuprofen. However, she has not reported any current stomach issues related to her medication use.    Previous HPI 11/29/23 Lauren Byrd is a 68 year old female who presents for evaluation of joint pain and stiffness and swelling affecting her hands and knees.   Her hands have been particularly affected over the past year, with a loss of strength. She has recently noticed that her fingers are bent out to the side and is not sure since how long ago this started. The stiffness occurs intermittently, especially when using her phone or playing games. No swelling or redness in her hands is noted.  She has  noticed some worsening of her grip strength particularly in the left hand.  No numbness in fingers.   Her knee stiffness has been ongoing for several years, and she reports frequent swelling. She has a history of left knee ACL injury and subsequent replacement surgery on one knee.  She often notices swelling in 1 or both knees particularly after increased use during the preceding day.  She does not see an orthopedist for her knee symptoms within the past few years.  She is not aware of any recent joint imaging.   For symptom management, she uses [facility] and takes ibuprofen, approximately two tablets per day, which she finds somewhat helpful. She denies any adverse effects from the medication.   In her family history, her mother also had arthritis.    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  She has recently taken up walking with a friend as a form of exercise.       Labs reviewed 06/2023 ANA neg RF neg   06/2021 HCV neg   Review of Systems  Constitutional:  Negative for fatigue.  HENT:  Negative for mouth sores and mouth dryness.   Eyes:  Negative for dryness.  Respiratory:  Negative for shortness of breath.   Cardiovascular:  Negative for chest pain and palpitations.  Gastrointestinal:  Negative for blood in stool, constipation and diarrhea.  Endocrine: Negative for increased urination.  Genitourinary:  Positive for involuntary urination.  Musculoskeletal:  Positive for joint pain, joint pain, joint swelling and morning stiffness. Negative for gait problem, myalgias, muscle weakness, muscle tenderness and myalgias.  Skin:  Negative for color change, rash, hair loss and sensitivity to sunlight.  Allergic/Immunologic: Negative for susceptible to infections.  Neurological:  Negative for dizziness and headaches.  Hematological:  Negative for swollen glands.  Psychiatric/Behavioral:  Positive for sleep disturbance. Negative for depressed mood. The patient is  nervous/anxious.     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   Colon cancer screening 11/29/2023   Acid reflux 11/29/2023   Female stress incontinence 07/29/2021   Anxiety    Arthritis     Past Medical History:  Diagnosis Date   Anxiety    Arthritis    GERD (gastroesophageal reflux disease)     Family History  Problem Relation Age of Onset   Cholecystitis Mother    Arthritis Mother    Cholecystitis Sister    Bladder Cancer Brother        in remission   Past Surgical History:  Procedure Laterality Date   ACL replacement     jaw joint replacement     VAGINAL HYSTERECTOMY     Social History   Social History Narrative   Not on file   Immunization History  Administered Date(s) Administered   Influenza Inj Mdck Quad With Preservative 03/17/2018   Influenza,inj,Quad PF,6+ Mos 06/24/2016   Influenza-Unspecified 02/08/2017, 02/08/2018   PNEUMOCOCCAL CONJUGATE-20 06/24/2021     Objective: Vital Signs: BP 120/73 (BP Location: Left Arm, Patient Position: Sitting, Cuff Size: Normal)   Pulse 66   Resp 16   Ht 5' 4 (1.626 m)   Wt 182 lb 3.2 oz (82.6 kg)   BMI 31.27 kg/m    Physical Exam Eyes:     Conjunctiva/sclera: Conjunctivae normal.  Cardiovascular:     Rate and Rhythm: Normal rate and regular rhythm.  Pulmonary:     Effort: Pulmonary effort is normal.     Breath sounds: Normal breath sounds.  Lymphadenopathy:     Cervical: No cervical adenopathy.  Skin:    General: Skin is warm and dry.  Neurological:     Mental Status: She is alert.  Psychiatric:        Mood and Affect: Mood normal.      Musculoskeletal Exam:  Shoulders full ROM no tenderness or swelling Elbows full ROM no tenderness or swelling Wrists full ROM, left wrist pain with full flexion no palpable effusion Some thenar muscle loss on left hand Fingers  with MCP joint widening, some nontender swelling present at right 2nd-3rd MCP, reducible lateral deviation worse on right hand Knees full ROM, anterior joint line tenderness to pressure  Limited musculoskeletal ultrasound exam does show a small joint effusion and color Doppler enhancement of the right third MCP joint  Investigation: No additional findings.  Imaging: No results found.  Recent Labs: Lab Results  Component Value Date   WBC 7.5 11/29/2023   HGB 13.3 11/29/2023   PLT 339 11/29/2023   NA 140 11/29/2023   K 5.0 11/29/2023   CL 104 11/29/2023   CO2 27 11/29/2023   GLUCOSE 93 11/29/2023   BUN 18 11/29/2023   CREATININE 0.83 11/29/2023   BILITOT 0.5 11/29/2023   ALKPHOS 60 01/14/2016   AST 15 11/29/2023  ALT 21 11/29/2023   PROT 7.0 11/29/2023   ALBUMIN 4.1 01/14/2016   CALCIUM 9.7 11/29/2023   GFRAA >60 01/14/2016    Speciality Comments: No specialty comments available.  Procedures:  No procedures performed Allergies: Amoxicillin -pot clavulanate, Oxycodone, and Sulfonamide derivatives   Assessment / Plan:     Visit Diagnoses: Bilateral hand pain - Plan: celecoxib  (CELEBREX ) 200 MG capsule Osteoarthritis of bilateral knees and hands Chronic osteoarthritis with pain in knees and hands, negative rheumatoid markers, normal inflammatory markers. Current ibuprofen and ginger root used.  Ultrasound examination actually does show synovitis at the right third MCP joint which could potentially be consistent with seronegative rheumatoid arthritis. Although would still be low disease activity and no erosive disease so okay to manage symptomatically for now, could consider adding trial of maintenance DMARD if needed. - Start celebrex  100 mg 1-2 times daily as needed - Monitor symptoms, reassess if flare-up or increased hand difficulty.  Gastroesophageal reflux disease GERD potentially worsened by NSAIDs, no symptoms reported. - Consider Celebrex  to reduce gastrointestinal  side effects compared to ibuprofen.        Orders: No orders of the defined types were placed in this encounter.  Meds ordered this encounter  Medications   celecoxib  (CELEBREX ) 200 MG capsule    Sig: Take 1 tablet (200 mg) by mouth 1-2 times daily for arthritis    Dispense:  60 capsule    Refill:  2     Follow-Up Instructions: Return in about 3 months (around 04/04/2024) for OA/?RA [facility] f/u 3mos.   Lonni LELON Ester, MD  Note - This record has been created using [employer].  Chart creation errors have been sought, but may not always  have been located. Such creation errors do not reflect on  the standard of medical care.

## 2024-01-03 ENCOUNTER — Ambulatory Visit: Attending: Internal Medicine | Admitting: Internal Medicine

## 2024-01-03 ENCOUNTER — Encounter: Payer: Self-pay | Admitting: Internal Medicine

## 2024-01-03 VITALS — BP 120/73 | HR 66 | Resp 16 | Ht 64.0 in | Wt 182.2 lb

## 2024-01-03 DIAGNOSIS — M79642 Pain in left hand: Secondary | ICD-10-CM

## 2024-01-03 DIAGNOSIS — M79641 Pain in right hand: Secondary | ICD-10-CM | POA: Diagnosis not present

## 2024-01-03 MED ORDER — CELECOXIB 200 MG PO CAPS: ORAL_CAPSULE | ORAL | 2 refills | Status: DC

## 2024-02-13 ENCOUNTER — Emergency Department (HOSPITAL_BASED_OUTPATIENT_CLINIC_OR_DEPARTMENT_OTHER)

## 2024-02-13 ENCOUNTER — Emergency Department (HOSPITAL_BASED_OUTPATIENT_CLINIC_OR_DEPARTMENT_OTHER): Admitting: Radiology

## 2024-02-13 ENCOUNTER — Emergency Department (HOSPITAL_BASED_OUTPATIENT_CLINIC_OR_DEPARTMENT_OTHER)
Admission: EM | Admit: 2024-02-13 | Discharge: 2024-02-13 | Disposition: A | Discharge status: Home / Self Care | Attending: Emergency Medicine | Admitting: Emergency Medicine

## 2024-02-13 ENCOUNTER — Other Ambulatory Visit: Payer: Self-pay

## 2024-02-13 DIAGNOSIS — S63286A Dislocation of proximal interphalangeal joint of right little finger, initial encounter: Secondary | ICD-10-CM | POA: Diagnosis not present

## 2024-02-13 DIAGNOSIS — S63259A Unspecified dislocation of unspecified finger, initial encounter: Secondary | ICD-10-CM

## 2024-02-13 DIAGNOSIS — W19XXXA Unspecified fall, initial encounter: Secondary | ICD-10-CM | POA: Insufficient documentation

## 2024-02-13 DIAGNOSIS — Z043 Encounter for examination and observation following other accident: Secondary | ICD-10-CM | POA: Diagnosis not present

## 2024-02-13 DIAGNOSIS — M25542 Pain in joints of left hand: Secondary | ICD-10-CM | POA: Diagnosis not present

## 2024-02-13 DIAGNOSIS — M25532 Pain in left wrist: Secondary | ICD-10-CM | POA: Diagnosis not present

## 2024-02-13 DIAGNOSIS — S6990XA Unspecified injury of unspecified wrist, hand and finger(s), initial encounter: Secondary | ICD-10-CM | POA: Diagnosis not present

## 2024-02-13 DIAGNOSIS — S63256A Unspecified dislocation of right little finger, initial encounter: Secondary | ICD-10-CM | POA: Diagnosis not present

## 2024-02-13 MED ORDER — OXYCODONE-ACETAMINOPHEN 5-325 MG PO TABS: 1.0000 | ORAL_TABLET | Freq: Four times a day (QID) | ORAL | 0 refills | Status: AC | PRN

## 2024-02-13 MED ORDER — BUPIVACAINE HCL 0.5 % IJ SOLN
5.0000 mL | Freq: Once | INTRAMUSCULAR | Status: AC
  Administered 2024-02-13: 5 mL

## 2024-02-13 MED ORDER — IOHEXOL 300 MG/ML  SOLN: 100.0000 mL | Freq: Once | INTRAMUSCULAR | Status: DC | PRN

## 2024-02-13 NOTE — ED Notes (Addendum)
 Per MD Patsey, wait until recently ordered imaging is back before splinting.

## 2024-02-13 NOTE — ED Provider Notes (Signed)
 [facility] AT [facility] Provider Note   CSN: [id number] Arrival date & time: 02/13/24  1729     Patient presents with: Hand Injury   Lauren Byrd is a 68 y.o. female.    Hand Injury Patient presents with left hand injury.  Mechanical fall.  Landed on hand.  No other injury.  Pain somewhat in the wrist and hand but mostly on the left fifth finger.  Cannot straighten the finger out.  Not on blood thinners.  No other injuries.     Prior to Admission medications   Medication Sig Start Date End Date Taking? Authorizing Provider  oxyCODONE-acetaminophen (PERCOCET/ROXICET) 5-325 MG tablet Take 1 tablet by mouth every 6 (six) hours as needed for severe pain (pain score 7-10). 02/13/24  Yes Patsey Lot, MD  Black Pepper-Turmeric (TURMERIC PLUS BLACK PEPPER EXT PO)     [provider]  calcium carbonate (OS-CAL) 600 MG tablet     [provider]  celecoxib  (CELEBREX ) 200 MG capsule Take 1 tablet (200 mg) by mouth 1-2 times daily for arthritis 01/03/24   Rice, Lonni ORN, MD  Cholecalciferol (VITAMIN D3 PO) Vitamin D3    [provider]  ELDERBERRY PO Take by mouth.    [provider]  Ginger, Zingiber officinalis, (GINGER ROOT) 550 MG CAPS  12/08/23   [provider]  levocetirizine (XYZAL ALLERGY 24HR) 5 MG tablet  05/11/22   [provider]  LORazepam  (ATIVAN ) 0.5 MG tablet Take 1-2 tablets (0.5-1 mg total) by mouth 2 (two) times daily as needed for anxiety. 09/28/18   Tapia, Leisa, PA-C  MELATONIN PO     [provider]  OLANZapine (ZYPREXA) 2.5 MG tablet TAKE 1 TO 2 TABLETS BY MOUTH ONCE DAILY AT BEDTIME 06/29/19   [provider]  sertraline  (ZOLOFT ) 50 MG tablet Take 50 mg by mouth daily.    [provider]    Allergies: Amoxicillin -pot clavulanate, Oxycodone, and Sulfonamide derivatives    Review of Systems  Updated Vital Signs BP (!) 167/89   Pulse 62   Temp  97.6 F (36.4 C)   Resp 18   Ht 5' 4 (1.626 m)   Wt 80.7 kg   SpO2 98%   BMI 30.55 kg/m   Physical Exam Vitals and nursing note reviewed.  Chest:     Chest wall: No tenderness.  Abdominal:     Tenderness: There is no abdominal tenderness.  Musculoskeletal:     Comments: Does have some tenderness over the left wrist particularly snuffbox area.  Tenderness over fifth finger.  Held at 90 degrees at the PIP joint.  Unable to extend.  Does have some tenderness in this area.  Neurological:     Mental Status: She is alert.     (all labs ordered are listed, but only abnormal results are displayed) Labs Reviewed - No data to display  EKG: None  Radiology: DG Wrist Complete Left Result Date: 02/13/2024 CLINICAL DATA:  Status post fall with pain. EXAM: LEFT WRIST - COMPLETE 3+ VIEW COMPARISON:  Hand radiograph earlier today FINDINGS: No acute fracture. Borderline widening of the scapholunate interval at 2 mm, less prominent than on prior exam. Question partial lunate dislocation, although not confirmed on the lateral view. Distal radius and ulna are intact. Soft tissue edema is seen about the wrist. IMPRESSION: 1. No acute fracture. 2. Borderline widening of the scapholunate interval at 2 mm, less prominent than on prior exam. Question partial lunate dislocation, although  not confirmed on the lateral view. Electronically Signed   By: Andrea Gasman M.D.   On: 02/13/2024 19:49   DG Finger Little Left Result Date: 02/13/2024 CLINICAL DATA:  Fall, postreduction. EXAM: LEFT FINGER(S) - 2+ VIEW COMPARISON:  Hand radiograph earlier today. FINDINGS: Reduction of the previous flexion deformity at the proximal interphalangeal joint. The question middle phalanx fracture is not seen on the current exam. No acute fracture. Soft tissue edema is most prominent overlying the dorsal proximal interphalangeal joint. Overlying dressing/splint in place. IMPRESSION: Reduction of previous flexion deformity at the  proximal interphalangeal joint. No acute fracture. Electronically Signed   By: Andrea Gasman M.D.   On: 02/13/2024 19:47   DG Hand Complete Left Result Date: 02/13/2024 CLINICAL DATA:  Status post fall. Second and third metacarpal pain, cannot move little finger. EXAM: LEFT HAND - COMPLETE 3+ VIEW COMPARISON:  Radiograph 11/29/2023 FINDINGS: The fifth digit is held at 90 degrees flexion at the proximal interphalangeal joint. Possible nondisplaced fracture of the middle phalanx, although obscured by overlying artifact from rings. No evidence of metacarpal fracture. Widening of the scapholunate interval is new from prior exam. The lunotriquetral alignment is not well assessed on provided views. Soft tissue edema overlies the distal metacarpals. IMPRESSION: 1. Possible nondisplaced fracture of the fifth middle phalanx, although obscured by overlying artifact from rings. The fifth digit is held in flexion at the proximal interphalangeal joint. 2. Widening of the scapholunate interval is new from prior exam, suboptimal assessment of the lunotriquetral alignment. Recommend dedicated wrist radiograph. Electronically Signed   By: Andrea Gasman M.D.   On: 02/13/2024 18:24     .Reduction of dislocation  Date/Time: 02/13/2024 7:40 PM  Performed by: Patsey Lot, MD Authorized by: Patsey Lot, MD  Consent: Verbal consent obtained. Written consent not obtained Risks and benefits: risks, benefits and alternatives were discussed Consent given by: patient Imaging studies: imaging studies available Patient identity confirmed: verbally with patient Local anesthesia used: yes Anesthesia: digital block  Anesthesia: Local anesthesia used: yes Local Anesthetic: bupivacaine 0.25% without epinephrine Anesthetic total: 3 mL  Sedation: Patient sedated: no  Patient tolerance: patient tolerated the procedure well with no immediate complications Comments: Reduction of dislocation of PIP joint of  right fifth finger.  With traction did have return of anatomic alignment.      Medications Ordered in the ED  iohexol (OMNIPAQUE) 300 MG/ML solution 100 mL ( Intravenous Canceled Entry 02/13/24 1808)  bupivacaine (MARCAINE) 0.5 % (with pres) injection 5 mL (5 mLs Infiltration Given 02/13/24 1903)                                    Medical Decision Making Amount and/or Complexity of Data Reviewed Radiology: ordered.  Risk Prescription drug management.   Patient with fall.  Initial x-ray showed possible fifth finger fracture over the middle phalanx.  However unable to bend at the PIP joint.  Thought likely dislocation although not read on initial radiology read.  Had reduction of the dislocation after anesthetizing with Marcaine.  Did have pop and likely was dislocated.  Repeat x-ray done and improved alignment and no fracture.  However does have some pain in the wrist.  Does have some mildly widened area of the scapholunate joint.  Does have some mild tenderness in the area.  Doubt frank dislocation  Will immobilize both wrist and thumb and the fifth finger.  Discharge home to follow-up  with hand surgery.     Final diagnoses:  Finger dislocation, initial encounter    ED Discharge Orders          Ordered    oxyCODONE-acetaminophen (PERCOCET/ROXICET) 5-325 MG tablet  Every 6 hours PRN        02/13/24 2017               Patsey Lot, MD 02/13/24 2018

## 2024-02-13 NOTE — ED Triage Notes (Signed)
 Pt POV reporting L hand/pinky pain after mechanical fall outside, no LOC, no blood thinners.

## 2024-02-13 NOTE — Discharge Instructions (Addendum)
 Wear the splint until follow-up.  There also potentially is a ligamentous damage in your wrist.  Follow-up with hand surgery.

## 2024-02-18 DIAGNOSIS — S63259A Unspecified dislocation of unspecified finger, initial encounter: Secondary | ICD-10-CM | POA: Diagnosis not present

## 2024-02-18 DIAGNOSIS — M25532 Pain in left wrist: Secondary | ICD-10-CM | POA: Diagnosis not present

## 2024-03-08 DIAGNOSIS — M25532 Pain in left wrist: Secondary | ICD-10-CM | POA: Diagnosis not present

## 2024-03-13 DIAGNOSIS — S63392A Traumatic rupture of other ligament of left wrist, initial encounter: Secondary | ICD-10-CM | POA: Diagnosis not present

## 2024-03-13 DIAGNOSIS — S63287A Dislocation of proximal interphalangeal joint of left little finger, initial encounter: Secondary | ICD-10-CM | POA: Diagnosis not present

## 2024-03-20 DIAGNOSIS — M25642 Stiffness of left hand, not elsewhere classified: Secondary | ICD-10-CM | POA: Diagnosis not present

## 2024-03-20 DIAGNOSIS — M25632 Stiffness of left wrist, not elsewhere classified: Secondary | ICD-10-CM | POA: Diagnosis not present

## 2024-03-21 ENCOUNTER — Other Ambulatory Visit: Payer: Self-pay | Admitting: Internal Medicine

## 2024-03-21 DIAGNOSIS — Z79899 Other long term (current) drug therapy: Secondary | ICD-10-CM

## 2024-03-21 DIAGNOSIS — M79641 Pain in right hand: Secondary | ICD-10-CM

## 2024-03-21 NOTE — Telephone Encounter (Signed)
 Last Fill: 01/03/2024  Labs: 11/29/2023 MCHC 31.8  Next Visit: 04/10/2024  Last Visit: 01/03/2024  DX: Bilateral hand pain   Current Dose per office note 01/03/2024: celebrex  100 mg 1-2 times daily as needed   Patient to update labs at upcomming appointment.   Okay to refill Celebrex ?

## 2024-03-30 NOTE — Progress Notes (Deleted)
 Office Visit Note  Patient: Lauren Byrd             Date of Birth: Jul 25, 1955           MRN: [id number]             PCP: Leavy Mole, PA-C (Inactive) Referring: Leavy Mole, PA-C Visit Date: 04/10/2024   Subjective:  No chief complaint on file.   History of Present Illness: Lauren Byrd is a 68 y.o. female here for follow up with osteoarthritis who presents with ongoing joint pain in her hands and knees.   Previous HPI 01/03/2024 Lauren Byrd is a 68 year old female with osteoarthritis who presents with ongoing joint pain in her hands and knees.   She experiences significant pain in both hands, particularly during use, described as a persistent ache that affects her daily activities. Pressing on the area exacerbates the pain. Lab tests evaluation at our initial visit, including antibody markers for rheumatoid arthritis and inflammation markers like sedimentation rate and C-reactive protein, are normal.    She experiences knee pain and has a history of ACL replacement in her knee.   She currently takes ibuprofen for pain management but reports it does not significantly alleviate her symptoms. She has also tried ginger root as a supplement. She has not used any prescription anti-inflammatory medications recently.   She has experienced acid reflux in the past, which can be exacerbated by frequent use of ibuprofen. However, she has not reported any current stomach issues related to her medication use.       Previous HPI 11/29/23 Lauren Byrd is a 68 year old female who presents for evaluation of joint pain and stiffness and swelling affecting her hands and knees.   Her hands have been particularly affected over the past year, with a loss of strength. She has recently noticed that her fingers are bent out to the side and is not sure since how long ago this started. The stiffness occurs intermittently, especially when using her phone or playing games. No swelling or redness in  her hands is noted.  She has noticed some worsening of her grip strength particularly in the left hand.  No numbness in fingers.   Her knee stiffness has been ongoing for several years, and she reports frequent swelling. She has a history of left knee ACL injury and subsequent replacement surgery on one knee.  She often notices swelling in 1 or both knees particularly after increased use during the preceding day.  She does not see an orthopedist for her knee symptoms within the past few years.  She is not aware of any recent joint imaging.   For symptom management, she uses [facility] and takes ibuprofen, approximately two tablets per day, which she finds somewhat helpful. She denies any adverse effects from the medication.   In her family history, her mother also had arthritis.    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  She has recently taken up walking with a friend as a form of exercise.       Labs reviewed 06/2023 ANA neg RF neg   06/2021 HCV neg   No Rheumatology ROS completed.   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   Colon cancer screening 11/29/2023   Acid reflux 11/29/2023  Female stress incontinence 07/29/2021   Anxiety    Arthritis     Past Medical History:  Diagnosis Date   Anxiety    Arthritis    GERD (gastroesophageal reflux disease)     Family History  Problem Relation Age of Onset   Cholecystitis Mother    Arthritis Mother    Cholecystitis Sister    Bladder Cancer Brother        in remission   Past Surgical History:  Procedure Laterality Date   ACL replacement     jaw joint replacement     VAGINAL HYSTERECTOMY     Social History   Social History Narrative   Not on file   Immunization History  Administered Date(s) Administered   Influenza Inj Mdck Quad With Preservative 03/17/2018    Influenza,inj,Quad PF,6+ Mos 06/24/2016   Influenza-Unspecified 02/08/2017, 02/08/2018   PNEUMOCOCCAL CONJUGATE-20 06/24/2021     Objective: Vital Signs: There were no vitals taken for this visit.   Physical Exam   Musculoskeletal Exam: ***  CDAI Exam: CDAI Score: -- Patient Global: --; Provider Global: -- Swollen: --; Tender: -- Joint Exam 04/10/2024   No joint exam has been documented for this visit   There is currently no information documented on the homunculus. Go to the Rheumatology activity and complete the homunculus joint exam.  Investigation: No additional findings.  Imaging: No results found.  Recent Labs: Lab Results  Component Value Date   WBC 7.5 11/29/2023   HGB 13.3 11/29/2023   PLT 339 11/29/2023   NA 140 11/29/2023   K 5.0 11/29/2023   CL 104 11/29/2023   CO2 27 11/29/2023   GLUCOSE 93 11/29/2023   BUN 18 11/29/2023   CREATININE 0.83 11/29/2023   BILITOT 0.5 11/29/2023   ALKPHOS 60 01/14/2016   AST 15 11/29/2023   ALT 21 11/29/2023   PROT 7.0 11/29/2023   ALBUMIN 4.1 01/14/2016   CALCIUM 9.7 11/29/2023   GFRAA >60 01/14/2016    Speciality Comments: No specialty comments available.  Procedures:  No procedures performed Allergies: Amoxicillin -pot clavulanate, Oxycodone , and Sulfonamide derivatives   Assessment / Plan:     Visit Diagnoses: No diagnosis found.  ***  Orders: No orders of the defined types were placed in this encounter.  No orders of the defined types were placed in this encounter.    Follow-Up Instructions: No follow-ups on file.   Seylah Wernert M Coltyn Hanning, CMA  Note - This record has been created using Animal nutritionist.  Chart creation errors have been sought, but may not always  have been located. Such creation errors do not reflect on  the standard of medical care.

## 2024-04-10 ENCOUNTER — Ambulatory Visit: Admitting: Internal Medicine

## 2024-04-10 DIAGNOSIS — M25532 Pain in left wrist: Secondary | ICD-10-CM | POA: Diagnosis not present

## 2024-04-10 DIAGNOSIS — M79641 Pain in right hand: Secondary | ICD-10-CM

## 2024-04-10 DIAGNOSIS — S638X2A Sprain of other part of left wrist and hand, initial encounter: Secondary | ICD-10-CM | POA: Diagnosis not present

## 2024-04-10 DIAGNOSIS — S63289A Dislocation of proximal interphalangeal joint of unspecified finger, initial encounter: Secondary | ICD-10-CM | POA: Diagnosis not present

## 2024-04-10 DIAGNOSIS — M25642 Stiffness of left hand, not elsewhere classified: Secondary | ICD-10-CM | POA: Diagnosis not present

## 2024-04-18 NOTE — Progress Notes (Deleted)
 Office Visit Note  Patient: Lauren Byrd             Date of Birth: 1955/06/11           MRN: [id number]             PCP: Leavy Mole, PA-C (Inactive) Referring: Leavy Mole, PA-C Visit Date: 04/28/2024   Subjective:  No chief complaint on file.   History of Present Illness: Lauren Byrd is a 68 y.o. female here for follow up with osteoarthritis who presents with ongoing joint pain in her hands and knees.   Previous HPI 01/03/2024 Lauren Byrd is a 68 year old female with osteoarthritis who presents with ongoing joint pain in her hands and knees.   She experiences significant pain in both hands, particularly during use, described as a persistent ache that affects her daily activities. Pressing on the area exacerbates the pain. Lab tests evaluation at our initial visit, including antibody markers for rheumatoid arthritis and inflammation markers like sedimentation rate and C-reactive protein, are normal.    She experiences knee pain and has a history of ACL replacement in her knee.   She currently takes ibuprofen for pain management but reports it does not significantly alleviate her symptoms. She has also tried ginger root as a supplement. She has not used any prescription anti-inflammatory medications recently.   She has experienced acid reflux in the past, which can be exacerbated by frequent use of ibuprofen. However, she has not reported any current stomach issues related to her medication use.       Previous HPI 11/29/23 Lauren Byrd is a 68 year old female who presents for evaluation of joint pain and stiffness and swelling affecting her hands and knees.   Her hands have been particularly affected over the past year, with a loss of strength. She has recently noticed that her fingers are bent out to the side and is not sure since how long ago this started. The stiffness occurs intermittently, especially when using her phone or playing games. No swelling or redness in  her hands is noted.  She has noticed some worsening of her grip strength particularly in the left hand.  No numbness in fingers.   Her knee stiffness has been ongoing for several years, and she reports frequent swelling. She has a history of left knee ACL injury and subsequent replacement surgery on one knee.  She often notices swelling in 1 or both knees particularly after increased use during the preceding day.  She does not see an orthopedist for her knee symptoms within the past few years.  She is not aware of any recent joint imaging.   For symptom management, she uses [facility] and takes ibuprofen, approximately two tablets per day, which she finds somewhat helpful. She denies any adverse effects from the medication.   In her family history, her mother also had arthritis.    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  She has recently taken up walking with a friend as a form of exercise.       Labs reviewed 06/2023 ANA neg RF neg   06/2021 HCV neg   No Rheumatology ROS completed.   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   Colon cancer screening 11/29/2023   Acid reflux 11/29/2023  Female stress incontinence 07/29/2021   Anxiety    Arthritis     Past Medical History:  Diagnosis Date   Anxiety    Arthritis    GERD (gastroesophageal reflux disease)     Family History  Problem Relation Age of Onset   Cholecystitis Mother    Arthritis Mother    Cholecystitis Sister    Bladder Cancer Brother        in remission   Past Surgical History:  Procedure Laterality Date   ACL replacement     jaw joint replacement     VAGINAL HYSTERECTOMY     Social History   Social History Narrative   Not on file   Immunization History  Administered Date(s) Administered   Influenza Inj Mdck Quad With Preservative 03/17/2018    Influenza,inj,Quad PF,6+ Mos 06/24/2016   Influenza-Unspecified 02/08/2017, 02/08/2018   PNEUMOCOCCAL CONJUGATE-20 06/24/2021     Objective: Vital Signs: There were no vitals taken for this visit.   Physical Exam   Musculoskeletal Exam: ***  CDAI Exam: CDAI Score: -- Patient Global: --; Provider Global: -- Swollen: --; Tender: -- Joint Exam 04/28/2024   No joint exam has been documented for this visit   There is currently no information documented on the homunculus. Go to the Rheumatology activity and complete the homunculus joint exam.  Investigation: No additional findings.  Imaging: No results found.  Recent Labs: Lab Results  Component Value Date   WBC 7.5 11/29/2023   HGB 13.3 11/29/2023   PLT 339 11/29/2023   NA 140 11/29/2023   K 5.0 11/29/2023   CL 104 11/29/2023   CO2 27 11/29/2023   GLUCOSE 93 11/29/2023   BUN 18 11/29/2023   CREATININE 0.83 11/29/2023   BILITOT 0.5 11/29/2023   ALKPHOS 60 01/14/2016   AST 15 11/29/2023   ALT 21 11/29/2023   PROT 7.0 11/29/2023   ALBUMIN 4.1 01/14/2016   CALCIUM 9.7 11/29/2023   GFRAA >60 01/14/2016    Speciality Comments: No specialty comments available.  Procedures:  No procedures performed Allergies: Amoxicillin -pot clavulanate, Oxycodone , and Sulfonamide derivatives   Assessment / Plan:     Visit Diagnoses: No diagnosis found.  ***  Orders: No orders of the defined types were placed in this encounter.  No orders of the defined types were placed in this encounter.    Follow-Up Instructions: No follow-ups on file.   Joanann Mies M Miyoshi Ligas, CMA  Note - This record has been created using Animal nutritionist.  Chart creation errors have been sought, but may not always  have been located. Such creation errors do not reflect on  the standard of medical care.

## 2024-04-21 ENCOUNTER — Other Ambulatory Visit: Payer: Self-pay | Admitting: Internal Medicine

## 2024-04-21 DIAGNOSIS — M79641 Pain in right hand: Secondary | ICD-10-CM

## 2024-04-21 NOTE — Telephone Encounter (Signed)
 Last Fill: 03/22/2024 (30 day supply)  Labs: 11/29/2023 MCHC 31.8  Next Visit: 04/28/2024  Last Visit: 01/03/2024  DX:  Bilateral hand pain   Current Dose per office note 01/03/2024: celebrex  100 mg 1-2 times daily as needed   Okay to refill Celebrex ?

## 2024-04-28 ENCOUNTER — Ambulatory Visit: Admitting: Internal Medicine

## 2024-04-28 DIAGNOSIS — M79641 Pain in right hand: Secondary | ICD-10-CM

## 2024-06-15 NOTE — Progress Notes (Unsigned)
 "  Office Visit Note  Patient: Lauren Byrd             Date of Birth: 05/03/1956           MRN: [id number]             PCP: Leavy Mole, PA-C (Inactive) Referring: Leavy Mole, PA-C Visit Date: 06/28/2024   Subjective:  No chief complaint on file.   History of Present Illness: Lauren Byrd is a 69 y.o. female here for follow up with osteoarthritis who presents with ongoing joint pain in her hands and knees.   Previous HPI 01/03/2024 Lauren Byrd is a 69 year old female with osteoarthritis who presents with ongoing joint pain in her hands and knees.   She experiences significant pain in both hands, particularly during use, described as a persistent ache that affects her daily activities. Pressing on the area exacerbates the pain. Lab tests evaluation at our initial visit, including antibody markers for rheumatoid arthritis and inflammation markers like sedimentation rate and C-reactive protein, are normal.    She experiences knee pain and has a history of ACL replacement in her knee.   She currently takes ibuprofen for pain management but reports it does not significantly alleviate her symptoms. She has also tried ginger root as a supplement. She has not used any prescription anti-inflammatory medications recently.   She has experienced acid reflux in the past, which can be exacerbated by frequent use of ibuprofen. However, she has not reported any current stomach issues related to her medication use.       Previous HPI 11/29/23 Lauren Byrd is a 69 year old female who presents for evaluation of joint pain and stiffness and swelling affecting her hands and knees.   Her hands have been particularly affected over the past year, with a loss of strength. She has recently noticed that her fingers are bent out to the side and is not sure since how long ago this started. The stiffness occurs intermittently, especially when using her phone or playing games. No swelling or redness in  her hands is noted.  She has noticed some worsening of her grip strength particularly in the left hand.  No numbness in fingers.   Her knee stiffness has been ongoing for several years, and she reports frequent swelling. She has a history of left knee ACL injury and subsequent replacement surgery on one knee.  She often notices swelling in 1 or both knees particularly after increased use during the preceding day.  She does not see an orthopedist for her knee symptoms within the past few years.  She is not aware of any recent joint imaging.   For symptom management, she uses [facility] and takes ibuprofen, approximately two tablets per day, which she finds somewhat helpful. She denies any adverse effects from the medication.   In her family history, her mother also had arthritis.    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  She has recently taken up walking with a friend as a form of exercise.       Labs reviewed 06/2023 ANA neg RF neg   06/2021 HCV neg   No Rheumatology ROS completed.   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   Colon cancer screening 11/29/2023   Acid reflux 11/29/2023  Female stress incontinence 07/29/2021   Anxiety    Arthritis     Past Medical History:  Diagnosis Date   Anxiety    Arthritis    GERD (gastroesophageal reflux disease)     Family History  Problem Relation Age of Onset   Cholecystitis Mother    Arthritis Mother    Cholecystitis Sister    Bladder Cancer Brother        in remission   Past Surgical History:  Procedure Laterality Date   ACL replacement     jaw joint replacement     VAGINAL HYSTERECTOMY     Social History   Social History Narrative   Not on file   Immunization History  Administered Date(s) Administered   Influenza Inj Mdck Quad With Preservative 03/17/2018    Influenza,inj,Quad PF,6+ Mos 06/24/2016   Influenza-Unspecified 02/08/2017, 02/08/2018   PNEUMOCOCCAL CONJUGATE-20 06/24/2021     Objective: Vital Signs: There were no vitals taken for this visit.   Physical Exam   Musculoskeletal Exam: ***   Investigation: No additional findings.  Imaging: No results found.  Recent Labs: Lab Results  Component Value Date   WBC 7.5 11/29/2023   HGB 13.3 11/29/2023   PLT 339 11/29/2023   NA 140 11/29/2023   K 5.0 11/29/2023   CL 104 11/29/2023   CO2 27 11/29/2023   GLUCOSE 93 11/29/2023   BUN 18 11/29/2023   CREATININE 0.83 11/29/2023   BILITOT 0.5 11/29/2023   ALKPHOS 60 01/14/2016   AST 15 11/29/2023   ALT 21 11/29/2023   PROT 7.0 11/29/2023   ALBUMIN 4.1 01/14/2016   CALCIUM 9.7 11/29/2023   GFRAA >60 01/14/2016    Speciality Comments: No specialty comments available.  Procedures:  No procedures performed Allergies: Amoxicillin -pot clavulanate, Oxycodone , and Sulfonamide derivatives   Assessment / Plan:     Visit Diagnoses:  Assessment & Plan Bilateral hand pain      ***  Follow-Up Instructions: No follow-ups on file.   Mikiah Demond M Robbin Escher, CMA  Note - This record has been created using Animal nutritionist.  Chart creation errors have been sought, but may not always  have been located. Such creation errors do not reflect on  the standard of medical care. "

## 2024-06-15 NOTE — Assessment & Plan Note (Signed)
 SABRA

## 2024-06-28 ENCOUNTER — Ambulatory Visit: Admitting: Internal Medicine

## 2024-06-28 DIAGNOSIS — M79641 Pain in right hand: Secondary | ICD-10-CM
